# Patient Record
Sex: Female | Born: 1950 | Race: Asian | Hispanic: No | Smoking: Never smoker
Health system: Southern US, Community
[De-identification: ages and names within clinical notes are randomized; demographics above are authoritative.]

## PROBLEM LIST (undated history)

## (undated) DIAGNOSIS — N390 Urinary tract infection, site not specified: Secondary | ICD-10-CM

## (undated) DIAGNOSIS — S52502A Unspecified fracture of the lower end of left radius, initial encounter for closed fracture: Secondary | ICD-10-CM

## (undated) DIAGNOSIS — E785 Hyperlipidemia, unspecified: Secondary | ICD-10-CM

## (undated) DIAGNOSIS — I10 Essential (primary) hypertension: Secondary | ICD-10-CM

## (undated) DIAGNOSIS — D509 Iron deficiency anemia, unspecified: Secondary | ICD-10-CM

## (undated) DIAGNOSIS — M1990 Unspecified osteoarthritis, unspecified site: Secondary | ICD-10-CM

## (undated) DIAGNOSIS — K2970 Gastritis, unspecified, without bleeding: Secondary | ICD-10-CM

## (undated) HISTORY — PX: HAND SURGERY: SHX662

---

## 2002-09-02 ENCOUNTER — Encounter (INDEPENDENT_AMBULATORY_CARE_PROVIDER_SITE_OTHER): Payer: Self-pay | Admitting: Specialist

## 2002-09-02 ENCOUNTER — Ambulatory Visit (HOSPITAL_COMMUNITY): Admission: RE | Admit: 2002-09-02 | Discharge: 2002-09-02 | Payer: Self-pay | Admitting: Gastroenterology

## 2005-05-22 ENCOUNTER — Emergency Department (HOSPITAL_COMMUNITY): Admission: EM | Admit: 2005-05-22 | Discharge: 2005-05-22 | Payer: Self-pay | Admitting: Emergency Medicine

## 2006-06-13 ENCOUNTER — Other Ambulatory Visit: Admission: RE | Admit: 2006-06-13 | Discharge: 2006-06-13 | Payer: Self-pay | Admitting: Obstetrics & Gynecology

## 2006-06-27 ENCOUNTER — Encounter: Admission: RE | Admit: 2006-06-27 | Discharge: 2006-06-27 | Payer: Self-pay | Admitting: Obstetrics & Gynecology

## 2006-08-07 ENCOUNTER — Emergency Department (HOSPITAL_COMMUNITY): Admission: EM | Admit: 2006-08-07 | Discharge: 2006-08-07 | Payer: Self-pay | Admitting: Emergency Medicine

## 2006-08-09 ENCOUNTER — Emergency Department (HOSPITAL_COMMUNITY): Admission: EM | Admit: 2006-08-09 | Discharge: 2006-08-09 | Payer: Self-pay | Admitting: Emergency Medicine

## 2006-11-27 ENCOUNTER — Emergency Department (HOSPITAL_COMMUNITY): Admission: EM | Admit: 2006-11-27 | Discharge: 2006-11-27 | Payer: Self-pay | Admitting: Family Medicine

## 2006-12-04 ENCOUNTER — Emergency Department (HOSPITAL_COMMUNITY): Admission: EM | Admit: 2006-12-04 | Discharge: 2006-12-04 | Payer: Self-pay | Admitting: Emergency Medicine

## 2006-12-20 ENCOUNTER — Encounter: Admission: RE | Admit: 2006-12-20 | Discharge: 2006-12-20 | Payer: Self-pay | Admitting: Internal Medicine

## 2007-03-20 ENCOUNTER — Emergency Department (HOSPITAL_COMMUNITY): Admission: EM | Admit: 2007-03-20 | Discharge: 2007-03-20 | Payer: Self-pay | Admitting: Emergency Medicine

## 2007-06-10 ENCOUNTER — Other Ambulatory Visit: Admission: RE | Admit: 2007-06-10 | Discharge: 2007-06-10 | Payer: Self-pay | Admitting: Obstetrics and Gynecology

## 2008-02-03 ENCOUNTER — Emergency Department (HOSPITAL_COMMUNITY): Admission: EM | Admit: 2008-02-03 | Discharge: 2008-02-03 | Payer: Self-pay | Admitting: Emergency Medicine

## 2008-06-10 ENCOUNTER — Other Ambulatory Visit: Admission: RE | Admit: 2008-06-10 | Discharge: 2008-06-10 | Payer: Self-pay | Admitting: Obstetrics & Gynecology

## 2009-04-14 ENCOUNTER — Encounter: Admission: RE | Admit: 2009-04-14 | Discharge: 2009-04-14 | Payer: Self-pay | Admitting: Family Medicine

## 2011-01-01 ENCOUNTER — Encounter: Payer: Self-pay | Admitting: Family Medicine

## 2011-01-23 ENCOUNTER — Ambulatory Visit (HOSPITAL_BASED_OUTPATIENT_CLINIC_OR_DEPARTMENT_OTHER)
Admission: RE | Admit: 2011-01-23 | Discharge: 2011-01-23 | Disposition: A | Discharge status: Home / Self Care | Payer: Worker's Compensation | Source: Ambulatory Visit | Attending: Orthopedic Surgery | Admitting: Orthopedic Surgery

## 2011-01-23 DIAGNOSIS — M65839 Other synovitis and tenosynovitis, unspecified forearm: Secondary | ICD-10-CM | POA: Insufficient documentation

## 2011-01-23 DIAGNOSIS — G5600 Carpal tunnel syndrome, unspecified upper limb: Secondary | ICD-10-CM | POA: Insufficient documentation

## 2011-01-23 DIAGNOSIS — M65849 Other synovitis and tenosynovitis, unspecified hand: Secondary | ICD-10-CM | POA: Insufficient documentation

## 2011-01-23 LAB — POCT I-STAT, CHEM 8: TCO2: 29 mmol/L (ref 0–100)

## 2011-01-29 NOTE — Op Note (Signed)
Brianna Camacho, Brianna Camacho                    ACCOUNT NO.:  [id number]  MEDICAL RECORD NO.:  [id number]          PATIENT TYPE:  LOCATION:                                 FACILITY:  PHYSICIAN:  Cindee Salt, M.D.            DATE OF BIRTH:  DATE OF PROCEDURE:  01/23/2011 DATE OF DISCHARGE:                              OPERATIVE REPORT   PREOPERATIVE DIAGNOSIS:  Stenosing tenosynovitis of left middle finger, carpal tunnel syndrome, left hand.  POSTOPERATIVE DIAGNOSIS:  Stenosing tenosynovitis of left middle finger, carpal tunnel syndrome, left hand.  OPERATION:  Release A1 pulley left middle finger, left carpal tunnel release.  SURGEON:  Cindee Salt, MD  ASSISTANT:  Betha Loa, MD  ANESTHESIA:  General with local infiltration.  ANESTHESIOLOGIST:  Janetta Hora. Gelene Mink, MD.  HISTORY:  The patient is a 60 year old female with a history of numbness and tingling, catching of her left middle finger.  Nerve conductions are positive for carpal tunnel syndrome.  This has not responded to conservative treatment.  She has elected to undergo surgical decompression.  Pre, peri and postoperative course were discussed along with risks and complications through an interpreter in the preoperative area.  Questions again are encouraged and answered.  Extremity marked by both the patient and surgeon.  Antibiotic given.  PROCEDURE IN DETAILS:  The patient was brought to the operating room where a general anesthetic was carried out without difficulty.  She was prepped using ChloraPrep, supine position, left arm free.  A 3-minute dry time was allowed.  Time-out taken confirming the patient procedure. An oblique incision was made over the A1 pulley of the left middle finger, carried down through subcutaneous tissue.  Bleeders were electrocauterized with bipolar.  Dissection carried down.  The A1 pulley was identified.  This was released.  A small incision was made centrally in the A2 pulley, incision  through A1 was made on its radial aspect.  A separate incision was then made over the carpal canal, carried down through subcutaneous tissue.  Bleeders again electrocauterized with bipolar.  The dissection carried down to the palmar fascia.  This was split revealing superficial palmar arch.  The flexor tendon of the ring little finger identified.  Retractors placed ulnarly and radially and a incision made through the carpal retinaculum to the ulnar side of the median nerve.  A right-angle and Sewall retractor were placed between skin and forearm fascia.  The fascia released for approximately a centimeter and half proximal to the wrist crease under direct vision. Canal was explored.  Air compression to the nerve was apparent.  Motor branch was noted under in the muscle.  The wounds were irrigated and closed with interrupted 5-0 Vicryl Rapide sutures.  Sterile compressive dressing and splint to the wrist was applied.  On deflation of the tourniquet, all fingers immediately pinked.  She was taken to the recovery room for observation in satisfactory condition.  She will be discharged home to return to the [facility] [place] in 1 week on Vicodin.          ______________________________  Cindee Salt, M.D.     GK/MEDQ  D:  01/23/2011  T:  01/24/2011  Job:  [id number]  Electronically Signed by Cindee Salt M.D. on 01/29/2011 02:26:12 PM

## 2011-03-08 ENCOUNTER — Emergency Department (HOSPITAL_COMMUNITY)
Admission: EM | Admit: 2011-03-08 | Discharge: 2011-03-08 | Disposition: A | Discharge status: Home / Self Care | Payer: BC Managed Care – PPO | Attending: Emergency Medicine | Admitting: Emergency Medicine

## 2011-03-08 ENCOUNTER — Emergency Department (HOSPITAL_COMMUNITY): Payer: BC Managed Care – PPO

## 2011-03-08 DIAGNOSIS — K219 Gastro-esophageal reflux disease without esophagitis: Secondary | ICD-10-CM | POA: Insufficient documentation

## 2011-03-08 DIAGNOSIS — R05 Cough: Secondary | ICD-10-CM | POA: Insufficient documentation

## 2011-03-08 DIAGNOSIS — Z79899 Other long term (current) drug therapy: Secondary | ICD-10-CM | POA: Insufficient documentation

## 2011-03-08 DIAGNOSIS — I10 Essential (primary) hypertension: Secondary | ICD-10-CM | POA: Insufficient documentation

## 2011-03-08 DIAGNOSIS — R110 Nausea: Secondary | ICD-10-CM | POA: Insufficient documentation

## 2011-03-08 DIAGNOSIS — R509 Fever, unspecified: Secondary | ICD-10-CM | POA: Insufficient documentation

## 2011-03-08 DIAGNOSIS — R51 Headache: Secondary | ICD-10-CM | POA: Insufficient documentation

## 2011-03-08 DIAGNOSIS — R059 Cough, unspecified: Secondary | ICD-10-CM | POA: Insufficient documentation

## 2011-03-08 DIAGNOSIS — R000 Tachycardia, unspecified: Secondary | ICD-10-CM | POA: Insufficient documentation

## 2011-03-08 LAB — CSF CELL COUNT WITH DIFFERENTIAL
Lymphs, CSF: 38 % — ABNORMAL LOW (ref 40–80)
RBC Count, CSF: 1390 /mm3 — ABNORMAL HIGH
Segmented Neutrophils-CSF: 46 % — ABNORMAL HIGH (ref 0–6)
Segmented Neutrophils-CSF: NONE SEEN % (ref 0–6)

## 2011-03-08 LAB — URINALYSIS, ROUTINE W REFLEX MICROSCOPIC
Glucose, UA: NEGATIVE mg/dL
Ketones, ur: NEGATIVE mg/dL
Protein, ur: NEGATIVE mg/dL

## 2011-03-08 LAB — CBC
HCT: 33.7 % — ABNORMAL LOW (ref 36.0–46.0)
Hemoglobin: 11 g/dL — ABNORMAL LOW (ref 12.0–15.0)
MCH: 21.6 pg — ABNORMAL LOW (ref 26.0–34.0)
Platelets: 545 10*3/uL — ABNORMAL HIGH (ref 150–400)

## 2011-03-08 LAB — BASIC METABOLIC PANEL
Chloride: 101 mEq/L (ref 96–112)
Creatinine, Ser: 1.52 mg/dL — ABNORMAL HIGH (ref 0.4–1.2)
GFR calc Af Amer: 42 mL/min — ABNORMAL LOW (ref >60)
Potassium: 3 mEq/L — ABNORMAL LOW (ref 3.5–5.1)
Sodium: 135 mEq/L (ref 135–145)

## 2011-03-08 LAB — DIFFERENTIAL
Basophils Relative: 0 % (ref 0–1)
Eosinophils Absolute: 0 10*3/uL (ref 0.0–0.7)
Lymphs Abs: 0.5 10*3/uL — ABNORMAL LOW (ref 0.7–4.0)
Monocytes Absolute: 1.1 10*3/uL — ABNORMAL HIGH (ref 0.1–1.0)
Neutro Abs: 16.7 10*3/uL — ABNORMAL HIGH (ref 1.7–7.7)

## 2011-03-08 LAB — SEDIMENTATION RATE: Sed Rate: 58 mm/hr — ABNORMAL HIGH (ref 0–22)

## 2011-03-08 LAB — PROTEIN, CSF: Total  Protein, CSF: 30 mg/dL (ref 15–45)

## 2011-03-08 LAB — PROCALCITONIN: Procalcitonin: 4.48 ng/mL

## 2011-03-08 LAB — LACTIC ACID, PLASMA: Lactic Acid, Venous: 1.8 mmol/L (ref 0.5–2.2)

## 2011-03-08 LAB — URINE MICROSCOPIC-ADD ON

## 2011-03-09 ENCOUNTER — Emergency Department (HOSPITAL_COMMUNITY): Payer: BC Managed Care – PPO

## 2011-03-09 ENCOUNTER — Inpatient Hospital Stay (HOSPITAL_COMMUNITY)
Admission: EM | Admit: 2011-03-09 | Discharge: 2011-03-11 | DRG: 584 | Disposition: A | Discharge status: Home / Self Care | Payer: BC Managed Care – PPO | Attending: Internal Medicine | Admitting: Internal Medicine

## 2011-03-09 DIAGNOSIS — R7881 Bacteremia: Principal | ICD-10-CM | POA: Diagnosis present

## 2011-03-09 DIAGNOSIS — D509 Iron deficiency anemia, unspecified: Secondary | ICD-10-CM | POA: Diagnosis present

## 2011-03-09 DIAGNOSIS — J189 Pneumonia, unspecified organism: Secondary | ICD-10-CM | POA: Diagnosis present

## 2011-03-09 DIAGNOSIS — A498 Other bacterial infections of unspecified site: Secondary | ICD-10-CM | POA: Diagnosis present

## 2011-03-09 DIAGNOSIS — I10 Essential (primary) hypertension: Secondary | ICD-10-CM | POA: Diagnosis present

## 2011-03-09 DIAGNOSIS — E785 Hyperlipidemia, unspecified: Secondary | ICD-10-CM | POA: Diagnosis present

## 2011-03-09 DIAGNOSIS — N390 Urinary tract infection, site not specified: Secondary | ICD-10-CM | POA: Diagnosis present

## 2011-03-09 LAB — CBC
MCH: 21.7 pg — ABNORMAL LOW (ref 26.0–34.0)
MCV: 66.2 fL — ABNORMAL LOW (ref 78.0–100.0)
Platelets: 453 10*3/uL — ABNORMAL HIGH (ref 150–400)
RBC: 4.79 MIL/uL (ref 3.87–5.11)
RDW: 15.7 % — ABNORMAL HIGH (ref 11.5–15.5)
WBC: 11.4 10*3/uL — ABNORMAL HIGH (ref 4.0–10.5)

## 2011-03-09 LAB — URINALYSIS, ROUTINE W REFLEX MICROSCOPIC
Glucose, UA: NEGATIVE mg/dL
pH: 7 (ref 5.0–8.0)

## 2011-03-09 LAB — URINE MICROSCOPIC-ADD ON

## 2011-03-09 LAB — POCT I-STAT, CHEM 8
Chloride: 110 mEq/L (ref 96–112)
HCT: 33 % — ABNORMAL LOW (ref 36.0–46.0)
Hemoglobin: 11.2 g/dL — ABNORMAL LOW (ref 12.0–15.0)
TCO2: 22 mmol/L (ref 0–100)

## 2011-03-09 LAB — URINALYSIS, DIPSTICK ONLY
Bilirubin Urine: NEGATIVE
Glucose, UA: NEGATIVE mg/dL
Ketones, ur: NEGATIVE mg/dL
Nitrite: NEGATIVE

## 2011-03-09 LAB — DIFFERENTIAL
Basophils Absolute: 0 10*3/uL (ref 0.0–0.1)
Eosinophils Absolute: 0.1 10*3/uL (ref 0.0–0.7)
Lymphs Abs: 1.1 10*3/uL (ref 0.7–4.0)
Monocytes Absolute: 0.8 10*3/uL (ref 0.1–1.0)
Monocytes Relative: 7 % (ref 3–12)
Neutro Abs: 9.4 10*3/uL — ABNORMAL HIGH (ref 1.7–7.7)
Neutrophils Relative %: 82 % — ABNORMAL HIGH (ref 43–77)

## 2011-03-09 LAB — URINE CULTURE: Culture  Setup Time: 201203291706

## 2011-03-10 LAB — COMPREHENSIVE METABOLIC PANEL
ALT: 39 U/L — ABNORMAL HIGH (ref 0–35)
Alkaline Phosphatase: 106 U/L (ref 39–117)
Chloride: 109 mEq/L (ref 96–112)
GFR calc non Af Amer: 54 mL/min — ABNORMAL LOW (ref >60)
Glucose, Bld: 114 mg/dL — ABNORMAL HIGH (ref 70–99)
Total Bilirubin: 0.4 mg/dL (ref 0.3–1.2)
Total Protein: 6 g/dL (ref 6.0–8.3)

## 2011-03-10 LAB — URINE CULTURE: Culture  Setup Time: 201203301507

## 2011-03-10 LAB — CBC
HCT: 29 % — ABNORMAL LOW (ref 36.0–46.0)
MCH: 21 pg — ABNORMAL LOW (ref 26.0–34.0)
RBC: 4.47 MIL/uL (ref 3.87–5.11)
RDW: 15.4 % (ref 11.5–15.5)

## 2011-03-10 LAB — TSH: TSH: 1.312 u[IU]/mL (ref 0.350–4.500)

## 2011-03-11 LAB — URINE CULTURE
Colony Count: NO GROWTH
Culture: NO GROWTH
Special Requests: POSITIVE

## 2011-03-11 LAB — FERRITIN: Ferritin: 96 ng/mL (ref 10–291)

## 2011-03-11 LAB — CBC
Hemoglobin: 8.8 g/dL — ABNORMAL LOW (ref 12.0–15.0)
WBC: 8.3 10*3/uL (ref 4.0–10.5)

## 2011-03-11 LAB — CULTURE, BLOOD (ROUTINE X 2): Culture  Setup Time: 201203291535

## 2011-03-11 LAB — IRON AND TIBC
Iron: 71 ug/dL (ref 42–135)
TIBC: 266 ug/dL (ref 250–470)

## 2011-03-12 LAB — CSF CULTURE W GRAM STAIN
Culture: NO GROWTH
Gram Stain: NONE SEEN

## 2011-03-15 LAB — CULTURE, BLOOD (ROUTINE X 2)

## 2011-03-30 NOTE — Discharge Summary (Signed)
NAMEMARCELLENE, SHIVLEY                   ACCOUNT NO.:  [id number]  MEDICAL RECORD NO.:  [id number]           PATIENT TYPE:  I  LOCATION:  5522                         FACILITY:  [facility]  PHYSICIAN:  Angeliyah Kirkey I Johnston Maddocks, MD      DATE OF BIRTH:  [birth year]  DATE OF ADMISSION:  03/09/2011 DATE OF DISCHARGE:  03/11/2011                              DISCHARGE SUMMARY   PRIMARY CARE PHYSICIAN:  Unassigned.  PRIMARY DIAGNOSES: 1. Escherichia coli bacteremia most likely from urinary tract     infection, pansensitive. 2. Iron-deficiency anemia. 3. Hypertension. 4. Left lower lobe community-acquired pneumonia.  PROCEDURES:  Chest x-ray consideration that if lower lobe suspicious for infection, follow up chest x-ray is recommended in 6-8 weeks to ensure cardiological resolution.  CONSULTATION:  None.  HISTORY OF PRESENT ILLNESS:  This is a 60 year old female who was seen in the emergency room 2 days prior to admission with history of fevers and headache.  The patient seen on February 26, 2011.  At that time, a CBC and BMET were done.  An LP was done which did show white blood cells of 0 and rbc's of 1, symptomatic because there is another lumbar puncture was done, white blood cells of 16 and rbc's of 13, iron 0.  The patient's glucose on the CSF was 106 and total protein was 30. Procalcitonin was 4.48, lactic acid was 1.8.  The patient was discharged home as meningitis was ruled out.  The patient received a call about positive blood culture and was asked to come to the emergency room for further assessment.  Accordingly, the patient admitted under medical record [id number].  On the day of admission, the patient had no fever.  The patient continued to complain of mild headache and some shortness of breath.  Chest x-ray suggesting pneumonia.  The patient is started on broad-spectrum antibiotics mainly Zithromax, terazosin and Cipro to cover for gram-negative rods.  PROBLEMS: 1. Escherichia coli  bacteremia most probably source is urinary tract     infection.  Repeat blood cultures, preliminary reports negative at     this time.  Escherichia coli is pansensitive.  The patient will be     discharged with Cipro 500 mg p.o. b.i.d. for 14 days, and the     patient was advised to followup with her primary care physician     after completion of treatment to assure resolution of infection.     Mostly likely cause of the patient's infection is urinary tract     infection. 2. Anemia with iron-deficiency anemia.  The patient noticed to be on     nonsteroid anti-inflammatory drugs which was stopped.  The patient     was advised to follow up with her primary care physician to arrange     endoscopy and colonoscopy and Percocet was prescribed.  The patient     had colonoscopy in the past.  The patient's daughter-in-law     informed about importance of repeating blood pressure to assure the     resolution of infection and also advised to do upper and lower  endoscopy.  Currently, the patient denies any chest pain or     shortness of breath.  Denies any nausea or vomiting.  PHYSICAL EXAMINATION:  HEART:  S1 and S2 with no added sounds. LUNGS:  No vesicular breathing with equal air entry. ABDOMEN:  Soft, nontender.  Bowel sounds positive. EXTREMITIES:  No lower extremity edema. BACK:  There is no point tenderness.  Currently, we felt the patient is medically stable for discharge.     Kimiko Common Bosie Helper, MD     HIE/MEDQ  D:  03/11/2011  T:  03/12/2011  Job:  [id number]  Electronically Signed by Ebony Cargo MD on 03/30/2011 05:36:17 AM

## 2011-04-05 NOTE — H&P (Signed)
NAMELoney Camacho                ACCOUNT NO.:  [id number]  MEDICAL RECORD NO.:  [id number]          PATIENT TYPE:  LOCATION:                                 FACILITY:  PHYSICIAN:  Brianna Camacho I Brianna Chrisley, MD      DATE OF BIRTH:  28-Dec-1950  DATE OF ADMISSION:  03/09/2011 DATE OF DISCHARGE:                             HISTORY & PHYSICAL   CHIEF COMPLAINT:  The patient received call from the emergency room with positive blood culture.  HISTORY OF PRESENT ILLNESS:  This is a 60 year old female who has two medical record number, the first medical record number by the name of Brianna Camacho, Brianna Camacho #[id number] where the patient presented to the emergency room in the name of Brianna Camacho with fever, chills.  Fever reached 101 associated with severe headache. At that time, blood culture was drawn from the patient.  The patient found to have white blood cells of 18.3, hemoglobin of 11, and hematocrit 33.7, ESR 58.  At that time, the patient underwent lumbar puncture and CSF fluid glucose was found to be 106 and protein 30, white blood cell 0 and rbc's 1 and apparently it was traumatic with white blood cell 16 and rbc 13.90.  The patient was discharged home from the emergency room with some pain medication as the patient did not have any evidence of meningitis.  Today, the patient received a call from the ED, she have Gram-negative in both blood culture and we were asked to admit the patient for further assessment. Currently, the patient is still complaining of low-grade headache. Denies any nausea or vomiting or abdominal pain.  Denies any coughing. She had a chest x-ray which was suspect left lower lobe pneumonia and we were asked to admit from Gram-positive blood culture and the above pneumonia.  ALLERGIES:  No known drug allergies.  PAST SURGICAL HISTORY:  None.  MEDICATIONS: 1. Losartan/hydrochlorothiazide. 2. Zocor. 3. Nabumetone 500 mg one tab twice daily.  SOCIAL HISTORY:  The patient works full  time.  Denies any alcohol abuse. Denies any smoking.  Denies any illicit drug abuse.  Apparently, the patient was offered colonoscopy in the past, but because of her busy schedule have not done till now.  SYSTEMIC REVIEW:  The patient denies any blurring of vision.  Denies any numbness or weakness of her extremities.  Denies any back pain or neck pain.  She complained of mild headache almost 4/7, relieved with some pain medication.  The patient denies any worsening headache.  The patient denies any shortness of breath or chest pain.  Denies any nausea or vomiting or abdominal pain.  Denies any burning micturition.  PHYSICAL EXAMINATION:  VITAL SIGNS:  Temperature 97.9, blood pressure 106/71, pulse rate 89, respiratory rate 19, saturation 92% on room air. The patient is not in respiratory distress or shortness of breath. HEENT:  Pupils are equal, reactive to light and accommodation. Extraocular muscle movement within normal. NECK:  Supple. No lymphadenopathy. HEART:  S1 and S2 with no added sounds. LUNGS:  Normal vesicular breathing with equal air entry. ABDOMEN:  Soft, nontender.  Bowel sounds positive. EXTREMITIES:  No lower  limb edema.  Peripheral pulses intact. CNS:  The patient is awake, alert, oriented x3 with no focal neurological deficit.  BLOOD WORKUP:  Hemoglobin 11.2, hematocrit 33.  Sodium 142, potassium 3.7, chloride 110, glucose 110, BUN 13, creatinine 1.3.  CBC, white blood cells 11.4, hemoglobin 10.4, hematocrit 31.7, and platelets 453. ESR 77.  Urinalysis with small leukocytes, white blood cells 7-10. Chest x-ray suggest community-acquired pneumonia.  ASSESSMENT AND PLAN: 1. Gram-negative rod on blood, two bottles, causes could be the urine.     We will start the patient on Cipro 400 mg IV and we will continue     with Rocephin and Zithromax to cover for the community-acquired     pneumonia.  We will repeat blood culture and urinalysis and     culture. 2.  Community-acquired pneumonia.  We will continue with Zithromax and     Rocephin.  The patient currently has cough, only complain of flu-     like symptoms. 3. Anemia, seem like iron-deficiency anemia.  We will get anemia panel     and stool guaiac. 4. Acute renal failure.  We will continue with IV fluid hydration.     Further recommendation as hospital course progress.  The patient     have LB which seem at the beginning traumatic, at the beginning     seem fine.  There is no evidence of virus or meningitis.  It is     likely traumatic with high white blood cells.  If headache did not     improve, we will proceed with MRI of the brain.  The patient has a     CT head which was negative on March 08, 2011, CT head same negative     without any hydrocephalus or acute infarct or masses.  Further     recommendation per hospital course.     Brianna Camacho Brianna Helper, MD     HIE/MEDQ  D:  04/03/2011  T:  04/03/2011  Job:  [id number]  Electronically Signed by Brianna Cargo MD on 04/05/2011 09:07:12 AM

## 2011-04-27 NOTE — Op Note (Signed)
NAME:  Brianna Camacho, Brianna Camacho                           ACCOUNT NO.:  [id number]   MEDICAL RECORD NO.:  [id number]                   PATIENT TYPE:  AMB   LOCATION:  ENDO                                 FACILITY:  [facility]   PHYSICIAN:  Charna Elizabeth, M.D.                   DATE OF BIRTH:  [birth year]   DATE OF PROCEDURE:  09/02/2002  DATE OF DISCHARGE:                                 OPERATIVE REPORT   PROCEDURE:  Esophagogastroduodenoscopy with biopsies.   ENDOSCOPIST:  Charna Elizabeth, M.D.   INSTRUMENT USED:  Olympus video panendoscope.   INDICATION FOR PROCEDURE:  Iron-deficiency anemia with hemoglobin down to  8.1 g/dl in a 61 year old Asian female.  Rule out peptic ulcer disease,  esophagitis, gastritis, etc.   PREPROCEDURE PREPARATION:  Informed consent was procured from the patient.  The patient was fasted for eight hours prior to the procedure.   PREPROCEDURE PHYSICAL:  VITAL SIGNS:  The patient had stable vital signs.  NECK:  Supple.  CHEST:  Clear to auscultation.  S1, S2 regular.  ABDOMEN:  Soft with normal bowel sounds.   DESCRIPTION OF PROCEDURE:  The patient was placed in the left lateral  decubitus position and sedated with 50 mg of Demerol and 5 mg of Versed  intravenously.  Once the patient was adequately sedate and maintained on low-  flow oxygen and continuous cardiac monitoring, the Olympus video  panendoscope was advanced through the mouthpiece, over the tongue, into the  esophagus under direct vision.  The entire esophagus appeared normal with no  evidence of ring, stricture, masses, esophagitis, or Barrett's mucosa.  The  scope was then advanced into the stomach.  There was severe, diffuse  hemorrhagic gastritis throughout the gastric mucosa.  Biopsies were done  from the antrum to rule out the presence of Helicobacter pylori by  pathology.  The rest of the exam was normal.  The proximal small bowel,  including the duodenal bulb, appeared normal and without lesions.   IMPRESSION:  1. Severe diffuse hemorrhagic gastritis, biopsies done for Helicobacter     pylori by pathology.  2. Normal-appearing esophagus and proximal small bowel.  3. No ulcers, erosions, or masses seen.   RECOMMENDATIONS:  1. Avoid all nonsteroidals, including aspirin.  2.     Await pathology results, treat with antibiotics if H. pylori present.  3. Prevacid 30 mg one p.o. q.d. has been prescribed for the patient, #30     have been prescribed with three refills.  4. Proceed with a colonoscopy at this time to further evaluate the patient's     iron-deficiency anemia.                                               Charna Elizabeth, M.D.  JM/MEDQ  D:  09/02/2002  T:  09/03/2002  Job:  [id number]   cc:   Earlene Plater L. Cloward, M.D.  [address]  [place]  [place] [address]  Fax: [phone]

## 2011-04-27 NOTE — Op Note (Signed)
   NAME:  Brianna Camacho, Brianna Camacho                           ACCOUNT NO.:  [id number]   MEDICAL RECORD NO.:  [id number]                   PATIENT TYPE:  AMB   LOCATION:  ENDO                                 FACILITY:  [facility]   PHYSICIAN:  Charna Elizabeth, M.D.                   DATE OF BIRTH:  01/06/1951   DATE OF PROCEDURE:  09/02/2002  DATE OF DISCHARGE:                                 OPERATIVE REPORT   PROCEDURE PERFORMED:  Screening colonoscopy.   ENDOSCOPIST:  Charna Elizabeth, M.D.   INSTRUMENT USED:  Olympus video colonoscope   INDICATIONS FOR PROCEDURE:  Iron deficiency anemia with hemoglobin down to  8.1 gm per dl in a 60 year old, Asian female.  Rule out colonic polyps,  masses, hemorrhoids, etc.   PREPROCEDURE PREPARATION:  Informed consent was procured from the patient.  The patient was fasted for eight hours prior to the procedure and prepped  with a bottle of magnesium citrate and a gallon of NuLytely the night prior  to the procedure.   PREPROCEDURE PHYSICAL:  VITAL SIGNS:  The patient had stable vital signs.  NECK:  Supple.  CHEST:  Clear to auscultation.  S1 and S2 regular.  ABDOMEN:  Soft with normal bowel sounds.   DESCRIPTION OF PROCEDURE:  The patient was placed in the left lateral  decubitus position and sedated with Demerol and Versed for EGD.  No  additional sedation was used for the colonoscopy.  Once the patient was  adequately sedated and maintained on low flow oxygen and continuous cardiac  monitoring, the Olympus video colonoscopic was advanced from the rectum to  the cecum and terminal ileum without difficulty.  The entire exam was normal  with no masses, polyps, erosions, ulcerations, ulcerations or diverticula  seen.   IMPRESSION:  Normal colonoscopy.    RECOMMENDATIONS:  1. Check CBC again today to monitor hemoglobin closely.  2. Outpatient follow up in the next two weeks, earlier if need be.                                               Charna Elizabeth,  M.D.    JM/MEDQ  D:  09/02/2002  T:  09/03/2002  Job:  [id number]   cc:   Earlene Plater L. Cloward, M.D.  [address]  [place]  [place] [address]  Fax: [phone]

## 2012-03-05 ENCOUNTER — Other Ambulatory Visit: Payer: Self-pay

## 2012-03-05 ENCOUNTER — Encounter (HOSPITAL_COMMUNITY): Payer: Self-pay | Admitting: *Deleted

## 2012-03-05 ENCOUNTER — Inpatient Hospital Stay (HOSPITAL_COMMUNITY)
Admission: EM | Admit: 2012-03-05 | Discharge: 2012-03-07 | DRG: 379 | Disposition: A | Discharge status: Home / Self Care | Payer: Self-pay | Attending: Internal Medicine | Admitting: Internal Medicine

## 2012-03-05 DIAGNOSIS — K254 Chronic or unspecified gastric ulcer with hemorrhage: Principal | ICD-10-CM | POA: Diagnosis present

## 2012-03-05 DIAGNOSIS — R531 Weakness: Secondary | ICD-10-CM

## 2012-03-05 DIAGNOSIS — D509 Iron deficiency anemia, unspecified: Secondary | ICD-10-CM | POA: Diagnosis present

## 2012-03-05 DIAGNOSIS — K279 Peptic ulcer, site unspecified, unspecified as acute or chronic, without hemorrhage or perforation: Secondary | ICD-10-CM | POA: Diagnosis present

## 2012-03-05 DIAGNOSIS — R5383 Other fatigue: Secondary | ICD-10-CM | POA: Diagnosis present

## 2012-03-05 DIAGNOSIS — R5381 Other malaise: Secondary | ICD-10-CM | POA: Diagnosis present

## 2012-03-05 DIAGNOSIS — K922 Gastrointestinal hemorrhage, unspecified: Secondary | ICD-10-CM | POA: Diagnosis present

## 2012-03-05 DIAGNOSIS — K921 Melena: Secondary | ICD-10-CM | POA: Diagnosis present

## 2012-03-05 DIAGNOSIS — I10 Essential (primary) hypertension: Secondary | ICD-10-CM | POA: Diagnosis present

## 2012-03-05 DIAGNOSIS — D649 Anemia, unspecified: Secondary | ICD-10-CM | POA: Diagnosis present

## 2012-03-05 DIAGNOSIS — E785 Hyperlipidemia, unspecified: Secondary | ICD-10-CM | POA: Diagnosis present

## 2012-03-05 HISTORY — DX: Gastritis, unspecified, without bleeding: K29.70

## 2012-03-05 HISTORY — DX: Essential (primary) hypertension: I10

## 2012-03-05 HISTORY — DX: Iron deficiency anemia, unspecified: D50.9

## 2012-03-05 HISTORY — DX: Hyperlipidemia, unspecified: E78.5

## 2012-03-05 LAB — CBC
HCT: 30.8 % — ABNORMAL LOW (ref 36.0–46.0)
Hemoglobin: 9.7 g/dL — ABNORMAL LOW (ref 12.0–15.0)
MCH: 21.1 pg — ABNORMAL LOW (ref 26.0–34.0)
MCHC: 31.5 g/dL (ref 30.0–36.0)
MCV: 67 fL — ABNORMAL LOW (ref 78.0–100.0)
Platelets: 705 10*3/uL — ABNORMAL HIGH (ref 150–400)
RBC: 4.6 MIL/uL (ref 3.87–5.11)
RDW: 15.4 % (ref 11.5–15.5)
WBC: 8.9 10*3/uL (ref 4.0–10.5)

## 2012-03-05 LAB — OCCULT BLOOD, POC DEVICE: Fecal Occult Bld: POSITIVE

## 2012-03-05 LAB — DIFFERENTIAL
Band Neutrophils: 0 % (ref 0–10)
Basophils Absolute: 0.1 10*3/uL (ref 0.0–0.1)
Basophils Relative: 1 % (ref 0–1)
Blasts: 0 %
Eosinophils Absolute: 0.1 10*3/uL (ref 0.0–0.7)
Eosinophils Relative: 1 % (ref 0–5)
Lymphocytes Relative: 22 % (ref 12–46)
Lymphs Abs: 2 10*3/uL (ref 0.7–4.0)
Metamyelocytes Relative: 0 %
Monocytes Absolute: 0.4 10*3/uL (ref 0.1–1.0)
Monocytes Relative: 5 % (ref 3–12)
Myelocytes: 0 %
Neutro Abs: 6.3 10*3/uL (ref 1.7–7.7)
Neutrophils Relative %: 71 % (ref 43–77)
Promyelocytes Absolute: 0 %
Smear Review: INCREASED
nRBC: 0 /100 WBC

## 2012-03-05 LAB — BASIC METABOLIC PANEL
BUN: 37 mg/dL — ABNORMAL HIGH (ref 6–23)
CO2: 24 mEq/L (ref 19–32)
Calcium: 9.2 mg/dL (ref 8.4–10.5)
Creatinine, Ser: 1.12 mg/dL — ABNORMAL HIGH (ref 0.50–1.10)
Glucose, Bld: 93 mg/dL (ref 70–99)

## 2012-03-05 MED ORDER — GI COCKTAIL ~~LOC~~
30.0000 mL | Freq: Once | ORAL | Status: AC
  Administered 2012-03-05: 30 mL via ORAL
  Filled 2012-03-05: qty 30

## 2012-03-05 MED ORDER — SODIUM CHLORIDE 0.9 % IV SOLN
Freq: Once | INTRAVENOUS | Status: AC
  Administered 2012-03-05: 20:00:00 via INTRAVENOUS

## 2012-03-05 MED ORDER — PANTOPRAZOLE SODIUM 40 MG IV SOLR
40.0000 mg | Freq: Once | INTRAVENOUS | Status: AC
  Administered 2012-03-05: 40 mg via INTRAVENOUS
  Filled 2012-03-05: qty 40

## 2012-03-05 NOTE — ED Notes (Signed)
Patient c/o black, tarry stools.

## 2012-03-05 NOTE — H&P (Signed)
PCP:  none   Chief Complaint:   Generalized weakness  HPI: Brianna Camacho is a 61 y.o. female   has a past medical history of Gastric ulcer; Hypertension; and Hyperlipemia.   Presented with  Weakness and melena for 2 days. She  has history of melena in the past with admittions. Last endoscopy done in 2003 by Dr. Loreta Ave showing Severe diffuse hemorrhagic gastritis but No ulcers, erosions, or masses seen. Colonoscopy at that time was negative. She denies any nausea vomiting or diarrhea. She had any fevers she does endorse some epigastric pain. She was states she does not take any aspirin ibuprofen Motrin Advil. Only takes medications as prescribed. History is somewhat difficult to obtain as patient is vietnamese speaking in [place] is somewhat limited.  Her Hg from 4/12 was 8.4, with chronicly low MCV at ~65  Hemoccult positive from below.   Review of Systems:    Pertinent positives include: melena, fatigue,  Constitutional:  No weight loss, night sweats, Fevers, chills,  weight loss  HEENT:  No headaches, Difficulty swallowing,Tooth/dental problems,Sore throat,  No sneezing, itching, ear ache, nasal congestion, post nasal drip,  Cardio-vascular:  No chest pain, Orthopnea, PND, anasarca, dizziness, palpitations.no Bilateral lower extremity swelling  GI:  No heartburn, indigestion, abdominal pain, nausea, vomiting, diarrhea, change in bowel habits, loss of appetite, , blood in stool, hematoemesis Resp:  no shortness of breath at rest. No dyspnea on exertion, No excess mucus, no productive cough, No non-productive cough, No coughing up of blood.No change in color of mucus.No wheezing. Skin:  no rash or lesions. No jaundice GU:  no dysuria, change in color of urine, no urgency or frequency. No straining to urinate.  No flank pain.  Musculoskeletal:  No joint pain or no joint swelling. No decreased range of motion. No back pain.  Psych:  No change in mood or affect. No depression or  anxiety. No memory loss.  Neuro: no localizing neurological complaints, no tingling, no weakness, no double vision, no gait abnormality, no slurred speech, no confusion  Otherwise ROS are negative except for above, 10 systems were reviewed  Past Medical History: Past Medical History  Diagnosis Date  . Gastric ulcer   . Hypertension   . Hyperlipemia    Past Surgical History  Procedure Date  . Cesarean section      Medications: Prior to Admission medications   Medication Sig Start Date End Date Taking? Authorizing Provider  Choline Fenofibrate (TRILIPIX) 135 MG capsule Take 135 mg by mouth daily.   Yes Historical Provider, MD  irbesartan (AVAPRO) 300 MG tablet Take 300 mg by mouth every morning.   Yes Historical Provider, MD  simvastatin (ZOCOR) 40 MG tablet Take 40 mg by mouth every morning.   Yes Historical Provider, MD    Allergies:  No Known Allergies  Social History:  Ambulatory  independently  Lives at home with husband   reports that she has never smoked. She does not have any smokeless tobacco history on file. She reports that she does not drink alcohol or use illicit drugs.   Family History: family history is not on file.    Physical Exam: Patient Vitals for the past 24 hrs:  BP Temp Temp src Pulse Resp SpO2 Height Weight  03/05/12 2309 124/85 mmHg 98.3 F (36.8 C) Oral 81  20  100 % - -  03/05/12 2032 133/85 mmHg - - 104  - - - -  03/05/12 2031 142/64 mmHg - - 90  - - - -  03/05/12 2030 138/66 mmHg - - 95  - - - -  03/05/12 1947 151/77 mmHg 98.4 F (36.9 C) Oral 85  16  99 % 5\' 3"  (1.6 m) 61.236 kg (135 lb)  03/05/12 1435 138/90 mmHg 98.2 F (36.8 C) Oral 100  16  100 % - -    1. General:  in No Acute distress 2. Psychological: Alert and Oriented 3. Head/ENT:   Moist  Mucous Membranes, pale                          Head Non traumatic, neck supple                          Normal Dentition 4. SKIN: normal Skin turgor,  Skin clean Dry and intact no  rash 5. Heart: Regular rate and rhythm no Murmur, Rub or gallop 6. Lungs: Clear to auscultation bilaterally, no wheezes or crackles   7. Abdomen: Soft, mild epigastric tenderness, Non distended 8. Lower extremities: no clubbing, cyanosis, or edema 9. Neurologically Grossly intact, moving all 4 extremities equally 10. MSK: Normal range of motion  body mass index is 23.91 kg/(m^2).   Labs on Admission:   [facility] 03/05/12 1947  NA 138  K 4.1  CL 104  CO2 24  GLUCOSE 93  BUN 37*  CREATININE 1.12*  CALCIUM 9.2  MG --  PHOS --   No results found for this basename: AST:2,ALT:2,ALKPHOS:2,BILITOT:2,PROT:2,ALBUMIN:2 in the last 72 hours No results found for this basename: LIPASE:2,AMYLASE:2 in the last 72 hours  Basename 03/05/12 1452  WBC 8.9  NEUTROABS 6.3  HGB 9.7*  HCT 30.8*  MCV 67.0*  PLT 705*    Basename 03/05/12 2113  CKTOTAL --  CKMB --  CKMBINDEX --  TROPONINI <0.30   No results found for this basename: TSH,T4TOTAL,FREET3,T3FREE,THYROIDAB in the last 72 hours No results found for this basename: VITAMINB12:2,FOLATE:2,FERRITIN:2,TIBC:2,IRON:2,RETICCTPCT:2 in the last 72 hours No results found for this basename: HGBA1C    Estimated Creatinine Clearance: 43.1 ml/min (by C-G formula based on Cr of 1.12). ABG    Component Value Date/Time   TCO2 22 03/09/2011 1409     No results found for this basename: DDIMER     Other results:  I have pearsonaly reviewed this: ECG REPORT  Rate: 84  Rhythm: NSR ST&T Change: no ischemic changes  Cultures:    Component Value Date/Time   SDES URINE, RANDOM 03/09/2011 2219   SPECREQUEST IMMUNE:NORM UT SYMPT:POS 03/09/2011 2219   CULT NO GROWTH 03/09/2011 2219   REPTSTATUS 03/11/2011 FINAL 03/09/2011 2219       Radiological Exams on Admission: No results found.  Assessment/Plan  61 year old female who with history of iron deficiency anemia and gastritis here with melena and stable anemia but somewhat more symptomatic  with new onset of episode of melena for 2 days  Present on Admission:  .Melena - most likely upper GI bleed patient have had history of gastritis in the past will give 80 mg Protonix now and follow up with twice a day dosing, patient does appear to be somewhat stable although she had an episode of tachycardia. She is normotensive. Denies lightheadedness. Given that her melena only been going on for 2 days will watch in the step down until we can tell  the severity of this. Will need GI consult in a.m. also that if decompensates, CBC every 6 hours, will give IV fluids. I suspect she  has chronic iron deficiency anemia.   .Anemia - obtain type and screen transfuse if hemoglobin are low 7 or the patient actively bleeding, I would expect her hemoglobin to drop with IV rehydration  . history of gastritis - make sure she is on Protonix, no recent NSAID use History of hypertension will hold her Avapro for now   Prophylaxis: SCD  Protonix   I have spent a total of 60 min on this admition  Khali Albanese 03/05/2012, 11:26 PM

## 2012-03-05 NOTE — ED Notes (Signed)
Per interpreter, vietnamese, patient is here due to feeling tired when she woke today.  She states when she went to bathroom,  Her stool was very dark.  She had similar sx last year with ulcer.  Patient denies abd pain.

## 2012-03-05 NOTE — ED Notes (Addendum)
First meeting with patient. Patient speaks very little english mainly [place]. Patient points to abdomen and states little pain. Patient resting with NAD at this time. Family at bedside.

## 2012-03-05 NOTE — ED Notes (Signed)
Used interpreter phones to update pt on plan of care, warm blanket given, NAD

## 2012-03-05 NOTE — ED Provider Notes (Signed)
History     CSN: [id number]  Arrival date & time 03/05/12  1425   First MD Initiated Contact with Patient 03/05/12 2001      Chief Complaint  Patient presents with  . Weakness     HPI  History provided by the patient and the anatomy is interpreter. Patient is a 61 year old female with history of hypertension, hyperlipidemia and gastric ulcer who presents with complaints of weakness earlier this morning and dark black stools. Patient reports having only one bowel movement today was a black thick stool. She also reports feeling lightheaded earlier in the day with some weakness and generalized fatigue. Patient reports that this has improved over the course the day and evening. Patient has not had any additional bowel movements. Patient also had some associated epigastric dull pain and burning. Patient states symptoms feel similar to previous diagnosis of gastric ulcer one year ago. Patient reports being in the hospital for that episode and did have a upper endoscopy and colonoscopy performed but does not recall the doctor's names. Patient currently does not have a primary care provider. Patient denies having any shortness of breath, near syncope or syncopal episodes today. Patient denies any chest pain or heart palpitations. Patient denies any other aggravating or alleviating factors.      Past Medical History  Diagnosis Date  . Gastric ulcer   . Hypertension   . Hyperlipemia     Past Surgical History  Procedure Date  . Cesarean section     No family history on file.  History  Substance Use Topics  . Smoking status: Never Smoker   . Smokeless tobacco: Not on file  . Alcohol Use: No    OB History    Grav Para Term Preterm Abortions TAB SAB Ect Mult Living                  Review of Systems  Constitutional: Positive for fatigue. Negative for fever and chills.  Respiratory: Negative for cough and shortness of breath.   Cardiovascular: Negative for chest pain and  palpitations.  Gastrointestinal: Negative for nausea, vomiting, diarrhea and constipation.  Neurological: Positive for light-headedness. Negative for dizziness, syncope and numbness.    Allergies  Review of patient's allergies indicates no known allergies.  Home Medications   Current Outpatient Rx  Name Route Sig Dispense Refill  . CHOLINE FENOFIBRATE 135 MG PO CPDR Oral Take 135 mg by mouth daily.    . IRBESARTAN 300 MG PO TABS Oral Take 300 mg by mouth every morning.    Marland Kitchen SIMVASTATIN 40 MG PO TABS Oral Take 40 mg by mouth every morning.      BP 151/77  Pulse 85  Temp(Src) 98.4 F (36.9 C) (Oral)  Resp 16  Ht 5\' 3"  (1.6 m)  Wt 135 lb (61.236 kg)  BMI 23.91 kg/m2  SpO2 99%  Physical Exam  Nursing note and vitals reviewed. Constitutional: She is oriented to person, place, and time. She appears well-developed and well-nourished. No distress.  HENT:  Head: Normocephalic and atraumatic.  Mouth/Throat: Oropharynx is clear and moist.  Eyes: Conjunctivae and EOM are normal. Pupils are equal, round, and reactive to light.  Neck: Normal range of motion. Neck supple.       No meningeal signs  Cardiovascular: Normal rate and regular rhythm.   Pulmonary/Chest: Effort normal and breath sounds normal. No respiratory distress. She has no wheezes. She has no rales.  Abdominal: Soft. She exhibits no distension. There is no tenderness. There  is no rebound.  Musculoskeletal: She exhibits no edema and no tenderness.  Neurological: She is alert and oriented to person, place, and time.  Skin: Skin is warm and dry. No rash noted.       Normal skin turgor  Psychiatric: She has a normal mood and affect. Her behavior is normal.    ED Course  Procedures   Results for orders placed during the hospital encounter of 03/05/12  CBC      Component Value Range   WBC 8.9  4.0 - 10.5 (K/uL)   RBC 4.60  3.87 - 5.11 (MIL/uL)   Hemoglobin 9.7 (*) 12.0 - 15.0 (g/dL)   HCT 47.8 (*) 29.5 - 46.0 (%)    MCV 67.0 (*) 78.0 - 100.0 (fL)   MCH 21.1 (*) 26.0 - 34.0 (pg)   MCHC 31.5  30.0 - 36.0 (g/dL)   RDW 62.1  30.8 - 65.7 (%)   Platelets 705 (*) 150 - 400 (K/uL)  DIFFERENTIAL      Component Value Range   Neutrophils Relative 71  43 - 77 (%)   Lymphocytes Relative 22  12 - 46 (%)   Monocytes Relative 5  3 - 12 (%)   Eosinophils Relative 1  0 - 5 (%)   Basophils Relative 1  0 - 1 (%)   Band Neutrophils 0  0 - 10 (%)   Metamyelocytes Relative 0     Myelocytes 0     Promyelocytes Absolute 0     Blasts 0     nRBC 0  0 (/100 WBC)   Neutro Abs 6.3  1.7 - 7.7 (K/uL)   Lymphs Abs 2.0  0.7 - 4.0 (K/uL)   Monocytes Absolute 0.4  0.1 - 1.0 (K/uL)   Eosinophils Absolute 0.1  0.0 - 0.7 (K/uL)   Basophils Absolute 0.1  0.0 - 0.1 (K/uL)   RBC Morphology TARGET CELLS     Smear Review PLATELETS APPEAR INCREASED    BASIC METABOLIC PANEL      Component Value Range   Sodium 138  135 - 145 (mEq/L)   Potassium 4.1  3.5 - 5.1 (mEq/L)   Chloride 104  96 - 112 (mEq/L)   CO2 24  19 - 32 (mEq/L)   Glucose, Bld 93  70 - 99 (mg/dL)   BUN 37 (*) 6 - 23 (mg/dL)   Creatinine, Ser 8.46 (*) 0.50 - 1.10 (mg/dL)   Calcium 9.2  8.4 - 96.2 (mg/dL)   GFR calc non Af Amer 52 (*) >90 (mL/min)   GFR calc Af Amer 60 (*) >90 (mL/min)  SAMPLE TO BLOOD BANK      Component Value Range   Blood Bank Specimen SAMPLE AVAILABLE FOR TESTING     Sample Expiration 03/06/2012    OCCULT BLOOD, POC DEVICE      Component Value Range   Fecal Occult Bld POSITIVE          1. Weakness   2. GI bleed       MDM  8:30 PM patient seen and evaluated. Patient in no acute distress.  Patient discussed with attending physician. Patient with weakness, positive Hemoccult and signs of anemia. Anemia appears chronic but could have concerns for acute process. Patient without primary care followup and language barrier. Plan for admission for patient.  Spoke with [facility] they will see pt and admit.   Date: 03/05/2012  Rate: 84   Rhythm: normal sinus rhythm  QRS Axis: normal  Intervals: normal  ST/T  Wave abnormalities: normal  Conduction Disutrbances:none  Narrative Interpretation: Possible left atrial enlargement  Old EKG Reviewed: No significant changes from 03/10/2011     Angus Seller, PA 03/06/12 [phone]

## 2012-03-06 ENCOUNTER — Encounter (HOSPITAL_COMMUNITY): Payer: Self-pay | Admitting: *Deleted

## 2012-03-06 ENCOUNTER — Other Ambulatory Visit: Payer: Self-pay

## 2012-03-06 ENCOUNTER — Encounter (HOSPITAL_COMMUNITY)
Admission: EM | Disposition: A | Discharge status: Home / Self Care | Payer: Self-pay | Source: Home / Self Care | Attending: Internal Medicine

## 2012-03-06 DIAGNOSIS — E785 Hyperlipidemia, unspecified: Secondary | ICD-10-CM | POA: Diagnosis present

## 2012-03-06 DIAGNOSIS — I10 Essential (primary) hypertension: Secondary | ICD-10-CM | POA: Diagnosis present

## 2012-03-06 DIAGNOSIS — R531 Weakness: Secondary | ICD-10-CM | POA: Diagnosis present

## 2012-03-06 DIAGNOSIS — K922 Gastrointestinal hemorrhage, unspecified: Secondary | ICD-10-CM | POA: Diagnosis present

## 2012-03-06 HISTORY — PX: HOT HEMOSTASIS: SHX5433

## 2012-03-06 HISTORY — PX: ESOPHAGOGASTRODUODENOSCOPY: SHX5428

## 2012-03-06 LAB — CBC
HCT: 26.9 % — ABNORMAL LOW (ref 36.0–46.0)
HCT: 26.9 % — ABNORMAL LOW (ref 36.0–46.0)
HCT: 27.1 % — ABNORMAL LOW (ref 36.0–46.0)
HCT: 27.2 % — ABNORMAL LOW (ref 36.0–46.0)
Hemoglobin: 9 g/dL — ABNORMAL LOW (ref 12.0–15.0)
Hemoglobin: 8.4 g/dL — ABNORMAL LOW (ref 12.0–15.0)
MCH: 21.7 pg — ABNORMAL LOW (ref 26.0–34.0)
MCH: 21.8 pg — ABNORMAL LOW (ref 26.0–34.0)
MCV: 67 fL — ABNORMAL LOW (ref 78.0–100.0)
MCV: 67.9 fL — ABNORMAL LOW (ref 78.0–100.0)
MCV: 68.2 fL — ABNORMAL LOW (ref 78.0–100.0)
Platelets: 590 10*3/uL — ABNORMAL HIGH (ref 150–400)
Platelets: 600 10*3/uL — ABNORMAL HIGH (ref 150–400)
Platelets: 657 10*3/uL — ABNORMAL HIGH (ref 150–400)
RBC: 3.98 MIL/uL (ref 3.87–5.11)
RBC: 3.98 MIL/uL (ref 3.87–5.11)
RBC: 3.99 MIL/uL (ref 3.87–5.11)
RBC: 4.06 MIL/uL (ref 3.87–5.11)
RBC: 4.15 MIL/uL (ref 3.87–5.11)
RDW: 15.6 % — ABNORMAL HIGH (ref 11.5–15.5)
WBC: 12.1 10*3/uL — ABNORMAL HIGH (ref 4.0–10.5)
WBC: 5.5 10*3/uL (ref 4.0–10.5)
WBC: 6.6 10*3/uL (ref 4.0–10.5)
WBC: 7.1 10*3/uL (ref 4.0–10.5)

## 2012-03-06 LAB — SAMPLE TO BLOOD BANK

## 2012-03-06 LAB — COMPREHENSIVE METABOLIC PANEL
AST: 21 U/L (ref 0–37)
Albumin: 3.4 g/dL — ABNORMAL LOW (ref 3.5–5.2)
BUN: 30 mg/dL — ABNORMAL HIGH (ref 6–23)
Calcium: 8.7 mg/dL (ref 8.4–10.5)
Chloride: 109 mEq/L (ref 96–112)
Creatinine, Ser: 1.01 mg/dL (ref 0.50–1.10)
Total Bilirubin: 0.3 mg/dL (ref 0.3–1.2)

## 2012-03-06 LAB — MAGNESIUM: Magnesium: 2.2 mg/dL (ref 1.5–2.5)

## 2012-03-06 LAB — MRSA PCR SCREENING: MRSA by PCR: NEGATIVE

## 2012-03-06 LAB — RETICULOCYTES: Retic Count, Absolute: 65 10*3/uL (ref 19.0–186.0)

## 2012-03-06 LAB — FERRITIN: Ferritin: 59 ng/mL (ref 10–291)

## 2012-03-06 LAB — PHOSPHORUS: Phosphorus: 3.1 mg/dL (ref 2.3–4.6)

## 2012-03-06 LAB — TSH: TSH: 2.682 u[IU]/mL (ref 0.350–4.500)

## 2012-03-06 LAB — APTT: aPTT: 29 seconds (ref 24–37)

## 2012-03-06 LAB — IRON AND TIBC
Iron: 84 ug/dL (ref 42–135)
Saturation Ratios: 19 % — ABNORMAL LOW (ref 20–55)
UIBC: 347 ug/dL (ref 125–400)

## 2012-03-06 LAB — FOLATE: Folate: 18.6 ng/mL

## 2012-03-06 LAB — TYPE AND SCREEN

## 2012-03-06 LAB — PROTIME-INR: INR: 1.01 (ref 0.00–1.49)

## 2012-03-06 SURGERY — EGD (ESOPHAGOGASTRODUODENOSCOPY)
Anesthesia: Moderate Sedation

## 2012-03-06 MED ORDER — ONDANSETRON HCL 4 MG/2ML IJ SOLN: 4.0000 mg | Freq: Four times a day (QID) | INTRAMUSCULAR | Status: DC | PRN

## 2012-03-06 MED ORDER — SODIUM CHLORIDE 0.9 % IV SOLN
80.0000 mg | Freq: Once | INTRAVENOUS | Status: AC
  Administered 2012-03-06: 80 mg via INTRAVENOUS
  Filled 2012-03-06: qty 80

## 2012-03-06 MED ORDER — SODIUM CHLORIDE 0.9 % IJ SOLN
3.0000 mL | Freq: Two times a day (BID) | INTRAMUSCULAR | Status: DC
  Administered 2012-03-07: 3 mL via INTRAVENOUS

## 2012-03-06 MED ORDER — ACETAMINOPHEN 325 MG PO TABS: 650.0000 mg | ORAL_TABLET | Freq: Four times a day (QID) | ORAL | Status: DC | PRN

## 2012-03-06 MED ORDER — DOCUSATE SODIUM 100 MG PO CAPS
100.0000 mg | ORAL_CAPSULE | Freq: Two times a day (BID) | ORAL | Status: DC
  Administered 2012-03-07: 100 mg via ORAL
  Filled 2012-03-06: qty 1

## 2012-03-06 MED ORDER — PANTOPRAZOLE SODIUM 40 MG IV SOLR
40.0000 mg | Freq: Two times a day (BID) | INTRAVENOUS | Status: DC
  Administered 2012-03-06 – 2012-03-07 (×2): 40 mg via INTRAVENOUS
  Filled 2012-03-06 (×5): qty 40

## 2012-03-06 MED ORDER — SODIUM CHLORIDE 0.9 % IV SOLN
INTRAVENOUS | Status: AC
  Administered 2012-03-06: 100 mL/h via INTRAVENOUS

## 2012-03-06 MED ORDER — ALBUTEROL SULFATE (5 MG/ML) 0.5% IN NEBU: 2.5000 mg | INHALATION_SOLUTION | RESPIRATORY_TRACT | Status: DC | PRN

## 2012-03-06 MED ORDER — FENTANYL CITRATE 0.05 MG/ML IJ SOLN
INTRAMUSCULAR | Status: AC
  Filled 2012-03-06: qty 2

## 2012-03-06 MED ORDER — FENTANYL NICU IV SYRINGE 50 MCG/ML
INJECTION | INTRAMUSCULAR | Status: DC | PRN
  Administered 2012-03-06 (×2): 25 ug via INTRAVENOUS

## 2012-03-06 MED ORDER — GUAIFENESIN-DM 100-10 MG/5ML PO SYRP: 5.0000 mL | ORAL_SOLUTION | ORAL | Status: DC | PRN

## 2012-03-06 MED ORDER — HYDROCODONE-ACETAMINOPHEN 5-325 MG PO TABS
1.0000 | ORAL_TABLET | ORAL | Status: DC | PRN
  Administered 2012-03-06: 1 via ORAL
  Filled 2012-03-06: qty 1

## 2012-03-06 MED ORDER — ONDANSETRON HCL 4 MG PO TABS: 4.0000 mg | ORAL_TABLET | Freq: Four times a day (QID) | ORAL | Status: DC | PRN

## 2012-03-06 MED ORDER — ALUM & MAG HYDROXIDE-SIMETH 200-200-20 MG/5ML PO SUSP: 30.0000 mL | Freq: Four times a day (QID) | ORAL | Status: DC | PRN

## 2012-03-06 MED ORDER — ACETAMINOPHEN 650 MG RE SUPP: 650.0000 mg | Freq: Four times a day (QID) | RECTAL | Status: DC | PRN

## 2012-03-06 MED ORDER — MIDAZOLAM HCL 10 MG/2ML IJ SOLN
INTRAMUSCULAR | Status: AC
  Filled 2012-03-06: qty 2

## 2012-03-06 MED ORDER — MIDAZOLAM HCL 10 MG/2ML IJ SOLN
INTRAMUSCULAR | Status: DC | PRN
  Administered 2012-03-06 (×2): 2 mg via INTRAVENOUS

## 2012-03-06 MED ORDER — BUTAMBEN-TETRACAINE-BENZOCAINE 2-2-14 % EX AERO
INHALATION_SPRAY | CUTANEOUS | Status: DC | PRN
  Administered 2012-03-06: 2 via TOPICAL

## 2012-03-06 MED ORDER — SODIUM CHLORIDE 0.9 % IV SOLN
INTRAVENOUS | Status: DC
  Administered 2012-03-06 – 2012-03-07 (×2): via INTRAVENOUS

## 2012-03-06 MED ORDER — SODIUM CHLORIDE 0.9 % IV SOLN
INTRAVENOUS | Status: DC
  Administered 2012-03-06: 14:00:00 via INTRAVENOUS

## 2012-03-06 NOTE — Progress Notes (Signed)
Utilization Review Completed.Dorcas Carrow T3/28/2013

## 2012-03-06 NOTE — Interval H&P Note (Signed)
History and Physical Interval Note:  03/06/2012 3:00 PM  [name]  has presented today for surgery, with the diagnosis of Anemia and melena  The various methods of treatment have been discussed with the patient and family. After consideration of risks, benefits and other options for treatment, the patient has consented to  Procedure(s) (LRB): ESOPHAGOGASTRODUODENOSCOPY (EGD) (N/A) as a surgical intervention .  The patients' history has been reviewed, patient examined, no change in status, stable for surgery.  I have reviewed the patients' chart and labs.  Questions were answered to the patient's satisfaction.     Kelii Chittum D

## 2012-03-06 NOTE — Progress Notes (Signed)
  [facility]   Subjective: History obtained through use of telephone translator. Patient speaks only [place] Currently denies abdominal pain. No apparent melena or upper red bleeding.  Objective: Vital signs in last 24 hours: Temp:  [98.1 F (36.7 C)-98.8 F (37.1 C)] 98.8 F (37.1 C) (03/28 1352) Pulse Rate:  [81-105] 95  (03/28 1352) Resp:  [15-20] 18  (03/28 1352) BP: (119-151)/(63-90) 149/81 mmHg (03/28 1352) SpO2:  [98 %-100 %] 99 % (03/28 1352) Weight:  [60.5 kg (133 lb 6.1 oz)-61.236 kg (135 lb)] 60.5 kg (133 lb 6.1 oz) (03/28 0321) Weight change:  Last BM Date: 03/05/12  Intake/Output from previous day: 03/27 0701 - 03/28 0700 In: 400 [I.V.:400] Out: -  Intake/Output this shift: Total I/O In: 500 [I.V.:500] Out: -   General appearance: alert, cooperative, appears stated age and no distress Resp: clear to auscultation bilaterally, room air saturation is 9% Cardio: regular rate and rhythm, S1, S2 normal, no murmur, click, rub or gallop GI: soft, non-tender; bowel sounds normal; no masses,  no organomegaly Extremities: extremities normal, atraumatic, no cyanosis or edema Neurologic: Grossly normal  Lab Results:  Basename 03/06/12 0839 03/06/12 0320  WBC 5.5 6.6  HGB 8.7* 9.0*  HCT 26.9* 28.3*  PLT 600* 590*   BMET  Basename 03/06/12 0320 03/05/12 1947  NA 140 138  K 3.9 4.1  CL 109 104  CO2 21 24  GLUCOSE 84 93  BUN 30* 37*  CREATININE 1.01 1.12*  CALCIUM 8.7 9.2    Studies/Results: No results found.  Medications:  I have reviewed the patient's current medications. Scheduled:   . sodium chloride   Intravenous Once  . docusate sodium  100 mg Oral BID  . gi cocktail  30 mL Oral Once  . pantoprazole (PROTONIX) IV  80 mg Intravenous Once  . pantoprazole (PROTONIX) IV  40 mg Intravenous Once  . pantoprazole (PROTONIX) IV  40 mg Intravenous Q12H  . sodium chloride  3 mL Intravenous Q12H    Assessment/Plan:  Principal Problem:  *Upper GIB (gastrointestinal bleeding)/ Peptic ulcer disease *Last evaluated as an outpatient 2003 for apparent anemia. At that time she had apparently been experiencing melena an upper endoscopy performed by Dr. Loreta Ave demonstrated diffuse gastritis *No apparent melena this admission and patient denies use of end-stage *Appreciate GI evaluation *For upper endoscopy today *Continue IV protonix  Active Problems:  Anemia *Hemoglobin has drifted downward from 9.7 and admission to 8.7 today *Continue to follow CBC    Weakness generalized *Suspect etiology from symptomatic anemia   HTN (hypertension) *Blood pressure stable *Home Avapro on hold   Dyslipidemia *Home Zocor on hold   Disposition *Remain in stepdown until source of GI bleeding clarified    LOS: 1 day   Junious Silk, ANP pager [phone]  [facility] 1 [email or web address] Password: [email or web address]  03/06/2012, 2:20 PM  I have examined the patient and reviewed the chart. I agree with the above note. EGD reveals multiple ulcers. Will cont IVF for hydration. She has been started on clear liquids. We will cont to follow in SDu for rebleeding.   Calvert Cantor, MD [phone]

## 2012-03-06 NOTE — ED Notes (Signed)
Due to patient name being incorrect all labs (previous and new orders) are being redrawn at this time.

## 2012-03-06 NOTE — Consult Note (Signed)
Reason for Consult:Melena and heme positive stool Referring Physician: [facility]  Brianna Camacho HPI: This is a 61 year old female with history of iron deficiency anemia who was evaluated by Dr. Loreta Ave in 2003 and most recently in 05/2011.  She underwent and EGD and colonoscopy both times revealing a gastritis.  No evidence of any overt bleeding and the gastric biopsies were negative for H. Pylori.  The patient started to notice melena over the past 1-2 days and she had an associated weakness.  No history of NSAID use and no complaints of abdominal pain.  As a result of her symptoms she presented to the ER and she was identified to have a microcytic anemia with heme positive stools.  As a result of her presentation a GI consultation was requested.  Past Medical History  Diagnosis Date  . Gastritis   . Hypertension   . Hyperlipemia   . Iron deficiency anemia     Past Surgical History  Procedure Date  . Cesarean section     History reviewed. No pertinent family history.  Social History:  reports that she has never smoked. She does not have any smokeless tobacco history on file. She reports that she does not drink alcohol or use illicit drugs.  Allergies: No Known Allergies  Medications:  Scheduled:   . sodium chloride   Intravenous Once  . docusate sodium  100 mg Oral BID  . gi cocktail  30 mL Oral Once  . pantoprazole (PROTONIX) IV  80 mg Intravenous Once  . pantoprazole (PROTONIX) IV  40 mg Intravenous Once  . pantoprazole (PROTONIX) IV  40 mg Intravenous Q12H  . sodium chloride  3 mL Intravenous Q12H   Continuous:   . sodium chloride 100 mL/hr (03/06/12 0344)  . sodium chloride 20 mL/hr at 03/06/12 1421    Results for orders placed during the hospital encounter of 03/05/12 (from the past 24 hour(s))  BASIC METABOLIC PANEL     Status: Abnormal   Collection Time   03/05/12  7:47 PM      Component Value Range   Sodium 138  135 - 145 (mEq/L)   Potassium 4.1  3.5 -  5.1 (mEq/L)   Chloride 104  96 - 112 (mEq/L)   CO2 24  19 - 32 (mEq/L)   Glucose, Bld 93  70 - 99 (mg/dL)   BUN 37 (*) 6 - 23 (mg/dL)   Creatinine, Ser 4.09 (*) 0.50 - 1.10 (mg/dL)   Calcium 9.2  8.4 - 81.1 (mg/dL)   GFR calc non Af Amer 52 (*) >90 (mL/min)   GFR calc Af Amer 60 (*) >90 (mL/min)  SAMPLE TO BLOOD BANK     Status: Normal   Collection Time   03/05/12  7:55 PM      Component Value Range   Blood Bank Specimen SAMPLE AVAILABLE FOR TESTING     Sample Expiration 03/05/2012    OCCULT BLOOD, POC DEVICE     Status: Normal   Collection Time   03/05/12  8:56 PM      Component Value Range   Fecal Occult Bld POSITIVE    TROPONIN I     Status: Normal   Collection Time   03/05/12  9:13 PM      Component Value Range   Troponin I <0.30  <0.30 (ng/mL)  TYPE AND SCREEN     Status: Normal   Collection Time   03/06/12 12:15 AM      Component Value  Range   ABO/RH(D) O POS     Antibody Screen NEG     Sample Expiration 03/09/2012    ABO/RH     Status: Normal   Collection Time   03/06/12 12:15 AM      Component Value Range   ABO/RH(D) O POS    CBC     Status: Abnormal   Collection Time   03/06/12 12:28 AM      Component Value Range   WBC 7.1  4.0 - 10.5 (K/uL)   RBC 4.06  3.87 - 5.11 (MIL/uL)   Hemoglobin 8.6 (*) 12.0 - 15.0 (g/dL)   HCT 91.4 (*) 78.2 - 46.0 (%)   MCV 67.0 (*) 78.0 - 100.0 (fL)   MCH 21.2 (*) 26.0 - 34.0 (pg)   MCHC 31.6  30.0 - 36.0 (g/dL)   RDW 95.6  21.3 - 08.6 (%)   Platelets 657 (*) 150 - 400 (K/uL)  VITAMIN B12     Status: Abnormal   Collection Time   03/06/12 12:28 AM      Component Value Range   Vitamin B-12 187 (*) 211 - 911 (pg/mL)  FOLATE     Status: Normal   Collection Time   03/06/12 12:28 AM      Component Value Range   Folate 18.6    IRON AND TIBC     Status: Abnormal   Collection Time   03/06/12 12:28 AM      Component Value Range   Iron 84  42 - 135 (ug/dL)   TIBC 578  469 - 629 (ug/dL)   Saturation Ratios 19 (*) 20 - 55 (%)   UIBC 347   125 - 400 (ug/dL)  FERRITIN     Status: Normal   Collection Time   03/06/12 12:28 AM      Component Value Range   Ferritin 59  10 - 291 (ng/mL)  RETICULOCYTES     Status: Normal   Collection Time   03/06/12 12:28 AM      Component Value Range   Retic Ct Pct 1.6  0.4 - 3.1 (%)   RBC. 4.06  3.87 - 5.11 (MIL/uL)   Retic Count, Manual 65.0  19.0 - 186.0 (K/uL)  GLUCOSE, CAPILLARY     Status: Normal   Collection Time   03/06/12  2:38 AM      Component Value Range   Glucose-Capillary 76  70 - 99 (mg/dL)  MRSA PCR SCREENING     Status: Normal   Collection Time   03/06/12  3:16 AM      Component Value Range   MRSA by PCR NEGATIVE  NEGATIVE   MAGNESIUM     Status: Normal   Collection Time   03/06/12  3:20 AM      Component Value Range   Magnesium 2.2  1.5 - 2.5 (mg/dL)  PHOSPHORUS     Status: Normal   Collection Time   03/06/12  3:20 AM      Component Value Range   Phosphorus 3.1  2.3 - 4.6 (mg/dL)  TSH     Status: Normal   Collection Time   03/06/12  3:20 AM      Component Value Range   TSH 2.682  0.350 - 4.500 (uIU/mL)  COMPREHENSIVE METABOLIC PANEL     Status: Abnormal   Collection Time   03/06/12  3:20 AM      Component Value Range   Sodium 140  135 - 145 (mEq/L)   Potassium  3.9  3.5 - 5.1 (mEq/L)   Chloride 109  96 - 112 (mEq/L)   CO2 21  19 - 32 (mEq/L)   Glucose, Bld 84  70 - 99 (mg/dL)   BUN 30 (*) 6 - 23 (mg/dL)   Creatinine, Ser 6.57  0.50 - 1.10 (mg/dL)   Calcium 8.7  8.4 - 84.6 (mg/dL)   Total Protein 6.5  6.0 - 8.3 (g/dL)   Albumin 3.4 (*) 3.5 - 5.2 (g/dL)   AST 21  0 - 37 (U/L)   ALT 18  0 - 35 (U/L)   Alkaline Phosphatase 40  39 - 117 (U/L)   Total Bilirubin 0.3  0.3 - 1.2 (mg/dL)   GFR calc non Af Amer 58 (*) >90 (mL/min)   GFR calc Af Amer 68 (*) >90 (mL/min)  CBC     Status: Abnormal   Collection Time   03/06/12  3:20 AM      Component Value Range   WBC 6.6  4.0 - 10.5 (K/uL)   RBC 4.15  3.87 - 5.11 (MIL/uL)   Hemoglobin 9.0 (*) 12.0 - 15.0 (g/dL)    HCT 96.2 (*) 95.2 - 46.0 (%)   MCV 68.2 (*) 78.0 - 100.0 (fL)   MCH 21.7 (*) 26.0 - 34.0 (pg)   MCHC 31.8  30.0 - 36.0 (g/dL)   RDW 84.1  32.4 - 40.1 (%)   Platelets 590 (*) 150 - 400 (K/uL)  APTT     Status: Normal   Collection Time   03/06/12  3:20 AM      Component Value Range   aPTT 29  24 - 37 (seconds)  PROTIME-INR     Status: Normal   Collection Time   03/06/12  3:20 AM      Component Value Range   Prothrombin Time 13.5  11.6 - 15.2 (seconds)   INR 1.01  0.00 - 1.49   CBC     Status: Abnormal   Collection Time   03/06/12  8:39 AM      Component Value Range   WBC 5.5  4.0 - 10.5 (K/uL)   RBC 3.98  3.87 - 5.11 (MIL/uL)   Hemoglobin 8.7 (*) 12.0 - 15.0 (g/dL)   HCT 02.7 (*) 25.3 - 46.0 (%)   MCV 67.6 (*) 78.0 - 100.0 (fL)   MCH 21.9 (*) 26.0 - 34.0 (pg)   MCHC 32.3  30.0 - 36.0 (g/dL)   RDW 66.4 (*) 40.3 - 15.5 (%)   Platelets 600 (*) 150 - 400 (K/uL)     No results found.  ROS:  As stated above in the HPI otherwise negative.  Blood pressure 130/85, pulse 95, temperature 98.8 F (37.1 C), temperature source Oral, resp. rate 16, height 5\' 3"  (1.6 m), weight 60.5 kg (133 lb 6.1 oz), SpO2 97.00%.    PE: Gen: NAD, Alert and Oriented HEENT:  [place]/AT, EOMI Neck: Supple, no LAD Lungs: CTA Bilaterally CV: RRR without M/G/R ABM: Soft, NTND, +BS Ext: No C/C/E  Assessment/Plan: 1) Melena. 2) Heme positive stool. 3) IDA.   There is a high likelihood that her bleeding site is from the upper GI tract.  Plan: 1) EGD today.  Shakeem Stern D 03/06/2012, 3:22 PM

## 2012-03-06 NOTE — Op Note (Signed)
[facility] [address] [place], [place]  [address]  OPERATIVE PROCEDURE REPORT  PATIENT:  Meloni, Hinz  MR#:  [id number] BIRTHDATE:  08-29-1951  GENDER:  female ENDOSCOPIST:  Jeani Hawking, MD ASSISTANT:  Beryle Beams, Technician and Janae Sauce, RN PROCEDURE DATE:  03/06/2012 PROCEDURE:  EGD with lesion ablation ASA CLASS:  Class II INDICATIONS:  Melena MEDICATIONS:  Demerol 40 mg IV, Versed 4 mg IV TOPICAL ANESTHETIC:  Cetacaine Spray  DESCRIPTION OF PROCEDURE:   After the risks benefits and alternatives of the procedure were thoroughly explained, informed consent was obtained.  The Pentax Gastroscope [id number] endoscope was introduced through the mouth and advanced to the second portion of the duodenum, without limitations.  The instrument was slowly withdrawn as the mucosa was fully examined. <<PROCEDUREIMAGES>>  FINDINGS:  In the antrum multiple ulcers were identified. The majority of the ulcers were in the process of healing, however, one ulcer did exhibit a hemocystic spot. No evidence of any active bleeding. The hemocystic spot was successfully treated with the BICAP probe. No other abnormalities identified.    Retroflexed views revealed no abnormalities.    The scope was then withdrawn from the patient and the procedure terminated.  COMPLICATIONS:  None  IMPRESSION:  1) Antral ulcers. RECOMMENDATIONS:  1) PPI QD indefinitely. 2) Follow up with Dr. Loreta Ave in 2-4 weeks.  ______________________________ Jeani Hawking, MD  n. Rosalie DoctorJeani Hawking at 03/06/2012 03:21 PM  [place], Reali Courtenay, [phone]

## 2012-03-06 NOTE — ED Notes (Signed)
Report called to [facility], RN on 3300. Patient being placed on zoll and being transported to 3308.

## 2012-03-07 LAB — CBC
HCT: 25.2 % — ABNORMAL LOW (ref 36.0–46.0)
HCT: 25.1 % — ABNORMAL LOW (ref 36.0–46.0)
Hemoglobin: 8 g/dL — ABNORMAL LOW (ref 12.0–15.0)
Hemoglobin: 8 g/dL — ABNORMAL LOW (ref 12.0–15.0)
MCH: 21.6 pg — ABNORMAL LOW (ref 26.0–34.0)
MCHC: 31.7 g/dL (ref 30.0–36.0)
MCHC: 31.9 g/dL (ref 30.0–36.0)
MCV: 67.9 fL — ABNORMAL LOW (ref 78.0–100.0)
RBC: 3.71 MIL/uL — ABNORMAL LOW (ref 3.87–5.11)
WBC: 12.9 10*3/uL — ABNORMAL HIGH (ref 4.0–10.5)

## 2012-03-07 LAB — BASIC METABOLIC PANEL
BUN: 16 mg/dL (ref 6–23)
Chloride: 106 mEq/L (ref 96–112)
GFR calc Af Amer: 72 mL/min — ABNORMAL LOW (ref >90)
GFR calc non Af Amer: 62 mL/min — ABNORMAL LOW (ref >90)
Potassium: 3.7 mEq/L (ref 3.5–5.1)
Sodium: 138 mEq/L (ref 135–145)

## 2012-03-07 LAB — IRON AND TIBC
Iron: 13 ug/dL — ABNORMAL LOW (ref 42–135)
Saturation Ratios: 3 % — ABNORMAL LOW (ref 20–55)
TIBC: 384 ug/dL (ref 250–470)
UIBC: 371 ug/dL (ref 125–400)

## 2012-03-07 LAB — FOLATE: Folate: >20 ng/mL

## 2012-03-07 LAB — VITAMIN B12: Vitamin B-12: 176 pg/mL — ABNORMAL LOW (ref 211–911)

## 2012-03-07 MED ORDER — FERROUS FUMARATE 325 (106 FE) MG PO TABS
1.0000 | ORAL_TABLET | Freq: Two times a day (BID) | ORAL | Status: DC
  Administered 2012-03-07: 106 mg via ORAL
  Filled 2012-03-07 (×2): qty 1

## 2012-03-07 MED ORDER — PANTOPRAZOLE SODIUM 40 MG PO TBEC
40.0000 mg | DELAYED_RELEASE_TABLET | Freq: Two times a day (BID) | ORAL | Status: DC
Start: 2012-03-07 — End: 2012-09-28

## 2012-03-07 MED ORDER — FERROUS FUMARATE 325 (106 FE) MG PO TABS: 1.0000 | ORAL_TABLET | Freq: Two times a day (BID) | ORAL | Status: DC

## 2012-03-07 NOTE — Progress Notes (Signed)
D/c home with husband with belongings, explained and discussed d/c instructions, f/u visits, prescriptions and care notes to pt and family via interpreter. Prescriptions given to husband and d/c paperwork. Beryle Quant

## 2012-03-07 NOTE — Discharge Summary (Signed)
DISCHARGE SUMMARY  Brianna Camacho  MR#: [id number]  DOB:Sep 06, 1951  Date of Admission: 03/05/2012 Date of Discharge: 03/07/2012  Attending Physician:Sircharles Holzheimer Butler Denmark, MD  Patient's PCP:No primary provider on file.  Consults: Jeani Hawking, MD- Gastroenterology  Pertinent discharge information: *Found to have gastric ulcers this admission causing GI bleeding which was symptomatic. Was started on Protonix his admission and is to followup with Dr. Loreta Ave after discharge *Does not have an established primary care physician and normally utilizes urgent care for followup. We given the patient the healthconnect number to help establish with a primary care physician   Procedures: Esophagogastroduodenoscopy complete 03/06/2012. Findings were a multiple ulcers in the gastric antrum. The majority of these ulcers were in the process of healing but one ulcer disease with edema cystic spot. No active bleeding. No other adenopathy is identified.  Discharge Diagnoses: Principal Problem:  *Upper GIB (gastrointestinal bleeding) Active Problems:  Anemia with Iron deffiency   Peptic ulcer disease  Weakness generalized  HTN (hypertension)  Dyslipidemia   Radiology: No results found.  Laboratory: Results for orders placed during the hospital encounter of 03/05/12 (from the past 48 hour(s))  CBC     Status: Abnormal   Collection Time   03/05/12  2:52 PM      Component Value Range Comment   WBC 8.9  4.0 - 10.5 (K/uL)    RBC 4.60  3.87 - 5.11 (MIL/uL)    Hemoglobin 9.7 (*) 12.0 - 15.0 (g/dL)    HCT 40.9 (*) 81.1 - 46.0 (%)    MCV 67.0 (*) 78.0 - 100.0 (fL)    MCH 21.1 (*) 26.0 - 34.0 (pg)    MCHC 31.5  30.0 - 36.0 (g/dL)    RDW 91.4  78.2 - 95.6 (%)    Platelets 705 (*) 150 - 400 (K/uL)   DIFFERENTIAL     Status: Normal   Collection Time   03/05/12  2:52 PM      Component Value Range Comment   Neutrophils Relative 71  43 - 77 (%)    Lymphocytes Relative 22  12 - 46 (%)    Monocytes Relative 5  3  - 12 (%)    Eosinophils Relative 1  0 - 5 (%)    Basophils Relative 1  0 - 1 (%)    Band Neutrophils 0  0 - 10 (%)    Metamyelocytes Relative 0      Myelocytes 0      Promyelocytes Absolute 0      Blasts 0      nRBC 0  0 (/100 WBC)    Neutro Abs 6.3  1.7 - 7.7 (K/uL)    Lymphs Abs 2.0  0.7 - 4.0 (K/uL)    Monocytes Absolute 0.4  0.1 - 1.0 (K/uL)    Eosinophils Absolute 0.1  0.0 - 0.7 (K/uL)    Basophils Absolute 0.1  0.0 - 0.1 (K/uL)    RBC Morphology TARGET CELLS      Smear Review PLATELETS APPEAR INCREASED     BASIC METABOLIC PANEL     Status: Abnormal   Collection Time   03/05/12  7:47 PM      Component Value Range Comment   Sodium 138  135 - 145 (mEq/L)    Potassium 4.1  3.5 - 5.1 (mEq/L)    Chloride 104  96 - 112 (mEq/L)    CO2 24  19 - 32 (mEq/L)    Glucose, Bld 93  70 - 99 (mg/dL)  BUN 37 (*) 6 - 23 (mg/dL)    Creatinine, Ser 9.60 (*) 0.50 - 1.10 (mg/dL)    Calcium 9.2  8.4 - 10.5 (mg/dL)    GFR calc non Af Amer 52 (*) >90 (mL/min)    GFR calc Af Amer 60 (*) >90 (mL/min)   SAMPLE TO BLOOD BANK     Status: Normal   Collection Time   03/05/12  7:55 PM      Component Value Range Comment   Blood Bank Specimen SAMPLE AVAILABLE FOR TESTING      Sample Expiration 03/05/2012     OCCULT BLOOD, POC DEVICE     Status: Normal   Collection Time   03/05/12  8:56 PM      Component Value Range Comment   Fecal Occult Bld POSITIVE     TROPONIN I     Status: Normal   Collection Time   03/05/12  9:13 PM      Component Value Range Comment   Troponin I <0.30  <0.30 (ng/mL)   TYPE AND SCREEN     Status: Normal   Collection Time   03/06/12 12:15 AM      Component Value Range Comment   ABO/RH(D) O POS      Antibody Screen NEG      Sample Expiration 03/09/2012     ABO/RH     Status: Normal   Collection Time   03/06/12 12:15 AM      Component Value Range Comment   ABO/RH(D) O POS     CBC     Status: Abnormal   Collection Time   03/06/12 12:28 AM      Component Value Range Comment     WBC 7.1  4.0 - 10.5 (K/uL)    RBC 4.06  3.87 - 5.11 (MIL/uL)    Hemoglobin 8.6 (*) 12.0 - 15.0 (g/dL)    HCT 45.4 (*) 09.8 - 46.0 (%)    MCV 67.0 (*) 78.0 - 100.0 (fL)    MCH 21.2 (*) 26.0 - 34.0 (pg)    MCHC 31.6  30.0 - 36.0 (g/dL)    RDW 11.9  14.7 - 82.9 (%)    Platelets 657 (*) 150 - 400 (K/uL)   VITAMIN B12     Status: Abnormal   Collection Time   03/06/12 12:28 AM      Component Value Range Comment   Vitamin B-12 187 (*) 211 - 911 (pg/mL)   FOLATE     Status: Normal   Collection Time   03/06/12 12:28 AM      Component Value Range Comment   Folate 18.6     IRON AND TIBC     Status: Abnormal   Collection Time   03/06/12 12:28 AM      Component Value Range Comment   Iron 84  42 - 135 (ug/dL)    TIBC 562  130 - 865 (ug/dL)    Saturation Ratios 19 (*) 20 - 55 (%)    UIBC 347  125 - 400 (ug/dL)   FERRITIN     Status: Normal   Collection Time   03/06/12 12:28 AM      Component Value Range Comment   Ferritin 59  10 - 291 (ng/mL)   RETICULOCYTES     Status: Normal   Collection Time   03/06/12 12:28 AM      Component Value Range Comment   Retic Ct Pct 1.6  0.4 - 3.1 (%)    RBC. 4.06  3.87 - 5.11 (MIL/uL)    Retic Count, Manual 65.0  19.0 - 186.0 (K/uL)   GLUCOSE, CAPILLARY     Status: Normal   Collection Time   03/06/12  2:38 AM      Component Value Range Comment   Glucose-Capillary 76  70 - 99 (mg/dL)   MRSA PCR SCREENING     Status: Normal   Collection Time   03/06/12  3:16 AM      Component Value Range Comment   MRSA by PCR NEGATIVE  NEGATIVE    MAGNESIUM     Status: Normal   Collection Time   03/06/12  3:20 AM      Component Value Range Comment   Magnesium 2.2  1.5 - 2.5 (mg/dL)   PHOSPHORUS     Status: Normal   Collection Time   03/06/12  3:20 AM      Component Value Range Comment   Phosphorus 3.1  2.3 - 4.6 (mg/dL)   TSH     Status: Normal   Collection Time   03/06/12  3:20 AM      Component Value Range Comment   TSH 2.682  0.350 - 4.500 (uIU/mL)    COMPREHENSIVE METABOLIC PANEL     Status: Abnormal   Collection Time   03/06/12  3:20 AM      Component Value Range Comment   Sodium 140  135 - 145 (mEq/L)    Potassium 3.9  3.5 - 5.1 (mEq/L)    Chloride 109  96 - 112 (mEq/L)    CO2 21  19 - 32 (mEq/L)    Glucose, Bld 84  70 - 99 (mg/dL)    BUN 30 (*) 6 - 23 (mg/dL)    Creatinine, Ser 5.78  0.50 - 1.10 (mg/dL)    Calcium 8.7  8.4 - 10.5 (mg/dL)    Total Protein 6.5  6.0 - 8.3 (g/dL)    Albumin 3.4 (*) 3.5 - 5.2 (g/dL)    AST 21  0 - 37 (U/L)    ALT 18  0 - 35 (U/L)    Alkaline Phosphatase 40  39 - 117 (U/L)    Total Bilirubin 0.3  0.3 - 1.2 (mg/dL)    GFR calc non Af Amer 58 (*) >90 (mL/min)    GFR calc Af Amer 68 (*) >90 (mL/min)   CBC     Status: Abnormal   Collection Time   03/06/12  3:20 AM      Component Value Range Comment   WBC 6.6  4.0 - 10.5 (K/uL)    RBC 4.15  3.87 - 5.11 (MIL/uL)    Hemoglobin 9.0 (*) 12.0 - 15.0 (g/dL)    HCT 46.9 (*) 62.9 - 46.0 (%)    MCV 68.2 (*) 78.0 - 100.0 (fL)    MCH 21.7 (*) 26.0 - 34.0 (pg)    MCHC 31.8  30.0 - 36.0 (g/dL)    RDW 52.8  41.3 - 24.4 (%)    Platelets 590 (*) 150 - 400 (K/uL)   APTT     Status: Normal   Collection Time   03/06/12  3:20 AM      Component Value Range Comment   aPTT 29  24 - 37 (seconds)   PROTIME-INR     Status: Normal   Collection Time   03/06/12  3:20 AM      Component Value Range Comment   Prothrombin Time 13.5  11.6 - 15.2 (seconds)    INR 1.01  0.00 - 1.49    CBC     Status: Abnormal   Collection Time   03/06/12  8:39 AM      Component Value Range Comment   WBC 5.5  4.0 - 10.5 (K/uL)    RBC 3.98  3.87 - 5.11 (MIL/uL)    Hemoglobin 8.7 (*) 12.0 - 15.0 (g/dL)    HCT 14.7 (*) 82.9 - 46.0 (%)    MCV 67.6 (*) 78.0 - 100.0 (fL)    MCH 21.9 (*) 26.0 - 34.0 (pg)    MCHC 32.3  30.0 - 36.0 (g/dL)    RDW 56.2 (*) 13.0 - 15.5 (%)    Platelets 600 (*) 150 - 400 (K/uL)   CBC     Status: Abnormal   Collection Time   03/06/12  4:24 PM      Component Value  Range Comment   WBC 7.4  4.0 - 10.5 (K/uL)    RBC 3.98  3.87 - 5.11 (MIL/uL)    Hemoglobin 8.4 (*) 12.0 - 15.0 (g/dL)    HCT 86.5 (*) 78.4 - 46.0 (%)    MCV 67.6 (*) 78.0 - 100.0 (fL)    MCH 21.1 (*) 26.0 - 34.0 (pg)    MCHC 31.2  30.0 - 36.0 (g/dL)    RDW 69.6  29.5 - 28.4 (%)    Platelets 596 (*) 150 - 400 (K/uL)   CBC     Status: Abnormal   Collection Time   03/06/12  9:08 PM      Component Value Range Comment   WBC 12.1 (*) 4.0 - 10.5 (K/uL)    RBC 3.99  3.87 - 5.11 (MIL/uL)    Hemoglobin 8.7 (*) 12.0 - 15.0 (g/dL)    HCT 13.2 (*) 44.0 - 46.0 (%)    MCV 67.9 (*) 78.0 - 100.0 (fL)    MCH 21.8 (*) 26.0 - 34.0 (pg)    MCHC 32.1  30.0 - 36.0 (g/dL)    RDW 10.2 (*) 72.5 - 15.5 (%)    Platelets 513 (*) 150 - 400 (K/uL)   CBC     Status: Abnormal   Collection Time   03/07/12  2:57 AM      Component Value Range Comment   WBC 12.2 (*) 4.0 - 10.5 (K/uL)    RBC 3.71 (*) 3.87 - 5.11 (MIL/uL)    Hemoglobin 8.0 (*) 12.0 - 15.0 (g/dL)    HCT 36.6 (*) 44.0 - 46.0 (%)    MCV 67.9 (*) 78.0 - 100.0 (fL)    MCH 21.6 (*) 26.0 - 34.0 (pg)    MCHC 31.7  30.0 - 36.0 (g/dL)    RDW 34.7  42.5 - 95.6 (%)    Platelets 598 (*) 150 - 400 (K/uL)   BASIC METABOLIC PANEL     Status: Abnormal   Collection Time   03/07/12  2:57 AM      Component Value Range Comment   Sodium 138  135 - 145 (mEq/L)    Potassium 3.7  3.5 - 5.1 (mEq/L)    Chloride 106  96 - 112 (mEq/L)    CO2 23  19 - 32 (mEq/L)    Glucose, Bld 104 (*) 70 - 99 (mg/dL)    BUN 16  6 - 23 (mg/dL) DELTA CHECK NOTED   Creatinine, Ser 0.96  0.50 - 1.10 (mg/dL)    Calcium 8.5  8.4 - 10.5 (mg/dL)    GFR calc non Af Amer 62 (*) >90 (mL/min)  GFR calc Af Amer 72 (*) >90 (mL/min)   CBC     Status: Abnormal   Collection Time   03/07/12  8:22 AM      Component Value Range Comment   WBC 12.9 (*) 4.0 - 10.5 (K/uL)    RBC 3.71 (*) 3.87 - 5.11 (MIL/uL)    Hemoglobin 8.0 (*) 12.0 - 15.0 (g/dL)    HCT 16.1 (*) 09.6 - 46.0 (%)    MCV 67.7 (*) 78.0  - 100.0 (fL)    MCH 21.6 (*) 26.0 - 34.0 (pg)    MCHC 31.9  30.0 - 36.0 (g/dL)    RDW 04.5 (*) 40.9 - 15.5 (%)    Platelets 562 (*) 150 - 400 (K/uL)   RETICULOCYTES     Status: Abnormal   Collection Time   03/07/12 10:27 AM      Component Value Range Comment   Retic Ct Pct 1.7  0.4 - 3.1 (%)    RBC. 3.64 (*) 3.87 - 5.11 (MIL/uL)    Retic Count, Manual 61.9  19.0 - 186.0 (K/uL)      Medication List  As of 03/07/2012  1:56 PM   TAKE these medications         ferrous fumarate 325 (106 FE) MG Tabs   Commonly known as: HEMOCYTE - 106 mg FE   Take 1 tablet (106 mg of iron total) by mouth 2 (two) times daily.      irbesartan 300 MG tablet   Commonly known as: AVAPRO   Take 300 mg by mouth every morning.      pantoprazole 40 MG tablet   Commonly known as: PROTONIX   Take 1 tablet (40 mg total) by mouth 2 (two) times daily.      simvastatin 40 MG tablet   Commonly known as: ZOCOR   Take 40 mg by mouth every morning.      TRILIPIX 135 MG capsule   Generic drug: Choline Fenofibrate   Take 135 mg by mouth daily.            History of present illness: 61 year old female presented to the emergency department with complaints of generalized weakness with melena for 2 days. Last endoscopic evaluation in 2003 by Dr. Loreta Ave at that time the patient was found to have diffuse hemorrhagic gastritis but no ulcers. Vascular that time was negative. Denied use of any type of nonsteroidal anti-inflammatory medication. History was limited by the patient speaking limited [place] and only [place] Hemoglobin from April 2012 was 8.4 with chronically low MCV around 65. She was normotensive at presentation and exam was otherwise unremarkable. Her BUN was 37 with a creatinine of 1.12 and hemoglobin was 9.7 with an MCV of 67. All other diagnostic evaluation and a MRSA is normal.  Hospital Course: Principal Problem:  *Upper GIB (gastrointestinal bleeding) Active Problems:  Anemia  Peptic ulcer  disease  Weakness generalized  HTN (hypertension)  Dyslipidemia  Upper GIB (gastrointestinal bleeding)/ Peptic ulcer disease Gastroenterology was consulted during this admission. Dr. Delfin Gant took the patient for upper endoscopy on 03/06/2012 which revealed multiple old ulcers but no active bleeding. Patient will continue on Protonix after discharge and is to followup with Dr. Loreta Ave in the office in 2-4 weeks she needs to call for an appointment. She has been given discharge instructions in [place]  Chronic microcytic Anemia Hemoglobin was 9.7 at admission. By the date of discharge hemoglobin has drifted to 8.7 which is actually the patient's baseline range. An anemia panel  reveals iron deffiency we will initiate iron therapy.  HTN (hypertension Has remained hemodynamically stable this admission and her Avapro was continued  Dyslipidemia Resume Zocor at discharge  Day of Discharge BP 119/71  Pulse 93  Temp(Src) 98.6 F (37 C) (Oral)  Resp 15  Ht 5\' 3"  (1.6 m)  Wt 60.5 kg (133 lb 6.1 oz)  BMI 23.63 kg/m2  SpO2 99%  Physical Exam:  General appearance: alert, cooperative, appears stated age and no distress Resp: clear to auscultation bilaterally Cardio: regular rate and rhythm, S1, S2 normal, no murmur, click, rub or gallop GI: soft, non-tender; bowel sounds normal; no masses,  no organomegaly Extremities: extremities normal, atraumatic, no cyanosis or edema Neurologic: Grossly normal  Follow-up: Call Dr. Kenna Gilbert office to be seen in 2-4 weeks for followup regarding her ulcers  Disposition:  Discharge to home via private vehicle   Junious Silk, ANP pager [phone]   I have examined the patient and reviewed the chart. I agree with the above note.   Calvert Cantor, MD [phone]

## 2012-03-07 NOTE — Discharge Instructions (Signed)
Ch?y Mu D? Dy-Ru?t  (Gastrointestinal Bleeding) Ch?y mu d? dy-ru?t (GI) l hi?n t??ng ch?y mu t? ru?t ho?c b?t k? n?i no gi?a mi?ng v h?u mn c?a b?n. N?u ch?y mu ch?m, b?n c th? ???c cho v? nh. N?u ch?y mu nhi?u, b?n th??ng ???c yu c?u n?m vi?n v theo di.  TRI?U CH?NG  B?n nn ra mu ?? t??i ho?c c ch?t gi?ng nh? b?t c ph.   B?n ?i ngoi ra mu ho?c phn c mu ?en v h?c n.  CH?N ?ON Chuyn gia ch?m [place] y t? c?a b?n c th? ch?n ?on tnh tr?ng c?a b?n b?ng cch xem b?nh s? v khm tr?c ti?p. C th? c?n thm cc xt nghi?m khc, bao g?m:  X-quang.   EGD (n?i soi th?c qu?n d? dy) ?? ki?m tra th?c qu?n, d? dy v ru?t non thng qua d?ng c? gi?ng nh? knh thin v?n d?o.   Soi ru?t k?t ?? ki?m tra ru?t k?t/ru?t gi thng qua m?t d?ng c? gi?ng nh? knh thin v?n d?o.   Sinh thi?t, xt nghi?m c?t m?t ph?n nh? c?a m ?? soi d??i knh hi?n vi.  Tm hi?u k?t qu? xt nghi?m Khng ph?i m?i k?t qu? ??u c s?n trong l?n khm c?a b?n. N?u k?t qu? xt nghi?m c?a b?n khng ???c tr? l?i trong l?n khm c?a b?n, hy h?n v?i chuyn gia ch?m [place] y t? ?? tm hi?u k?t qu?Imagene Sheller cho m?i th? l bnh th??ng n?u b?n khng th?y chuyn gia ch?m [place] y t? ho?c c? s? y t? ni g. ?i?u quan tr?ng ??i v?i b?n l theo di m?i k?t qu? xt nghi?m c?a b?n. H??NG D?N CH?M [place] T?I NH  Th?c hi?n theo h??ng d?n nh? ?? xu?t b?i chuyn gia ch?m [place] y t? c?a b?n lin quan ??n thu?c. Khng dng aspirin, r??u ho?c thu?c gi?m ?au v thu?c tr? vim kh?p, tr? khi chuyn gia ch?m [place] y t? c?a b?n ni b?n c th? lm nh? v?y.   Nh?n s? ch?m  theo sau khi lm xong cc xt nghi?m ny, theo ?? ngh?.  HY ??N KHM B?NH NGAY L?P T?C N?U:  B?n ngy cng ch?y mu nhi?u ho?c b?n b?t ??u h?i ?au ??u, suy nh??c ho?c c nh?ng lc u? o?i.   B?n b? ?au th?t nghim tr?ng trong d? dy, ? l?ng ho?c trong b?ng.   B?n ?i ra nh?ng c?c mu l?n.   B?nh khi?n b?n lc ??u ph?i ?i khm ngy cng tr?m tr?ng h?n.  HY CH?C CH?N R?NG  B?N:  Hi?u r nh?ng h??ng d?n khi xu?t vi?n.   S? theo di tnh tr?ng b?nh c?a b?n.   S? ??n khm b?nh ngay l?p t?c nh? ? ???c h??ng d?n.  Document Released: 11/26/2005 Document Revised: 11/15/2011 [facility] Patient Information 2012 [place], [place].Lot ???ng Tiu Ha (Ulcers of the Gastrointestinal Tract) B?n b? lot hay c th? th??ng b? lot h?n h?u h?t m?i ng??i. Lot l ch? n?t ra hay l l? th?ng ? l?p nim m?c c?a th?c qu?n (?ng d?n th?c ?n t? mi?ng ??n d? dy), d? dy, hay ph?n ??u c?a ru?t non. NGUYN NHN  Vi trng (vi khu?n). C m?t lo?i vi khu?n lin quan ??n lot ???c g?i l Helicobacter Pylori.   D??c ph?m nh? cc thu?c khng vim khng steroid.   Ht thu?c l c lin quan ??n lot v n khng gip v?t lot lnh l?i.  TRI?U CH?NG  Nng rt hay  c?n co ? vng th??ng v? (b?ng). Tri?u ch?ng ny th??ng gi?m ?i khi ?n ho?c u?ng thu?c lm gi?m ?? axit trong d? dy.   C?m gic nn nao trong d? dy (bu?n nn).   Ph.   i m?a.   N?u v?t lot d?n ??n ch?y mu, n c th? gy ra:   ?i tiu phn ?en.   i ra mu ?? t??i.   Khi ch?y mu n?ng, c th?:   Hn m v s?c.   i ra ch?t gi?ng b c ph.  CH?N ?ON Vi?c nghin c?u ?i?u g khng ?n (ch?n ?on) th??ng ???c th?c hi?n d?a vo X quang (ch?p X quang v?i ch?t c?n quang) v n?i soi ???ng tiu ha trn. V?i vi?c n?i soi, m?t ?ng soi m?m ???c s? d?ng ?? quan st th?c qu?n, d? dy, v ru?t non. Nh?ng v? tr b?t th??ng c th? ???c sinh thi?t. Sinh thi?t l l?y m?t m?u m nh? ?em kh?o st d??i knh hi?n vi. ? nh?ng ng??i tr? tu?i, c th? ?i?u tr? m?t cch an ton lot ???ng tiu ha m khng c?n th?c hi?n cc xt nghi?m (v d? nh? ch?p X quang v?i ch?t c?n quang) v nn ti?n hnh cc xt nghi?m ch?n ?on ? nh?ng b?nh nhn khng ?p ?ng v?i ?i?u tr?. ?I?U TR?  Trnh ht thu?c l, u?ng r??u, v cc th?c ?n m c v? nh? lm c?n ?au tr?m tr?ng h?n. Ht thu?c l s? lm ch?m l?i qu trnh lnh v?t lot.   S? d?ng cc lo?i thu?c khc  nh? ch? d?n. Bc s? c th? k ??n cc lo?i thu?c ???c bi?t nh? thu?c ?c ch? H2 lm gi?m qu trnh s?n sinh ra ch?t axit. ?y l nh?ng d??c ph?m nh? cimetidine, ranitidine, famotidine, v cc thu?c khc. Cc thu?c khc c th? mua ???c ?? gip b?o v? l?p nim m?c ru?t.   Ti?p t?c lm vi?c v ho?t ??ng bnh th??ng tr? khi ???c ch? th? khc c?a Bc s?.   N?u cc ph??ng php ?i?u tr? ? lot thng th??ng th?t b?i, hy h?i Bc s? vi?c cn nh?c s? d?ng thu?c khng sinh. ?y l cc lo?i thu?c gi?t vi trng.  HY NGAY L?P T?C THAM V?N V?I CHUYN GIA Y T? N?U: Xu?t hi?n cc tri?u ch?ng  Ch?y mu ?? t??i.   i ra mu.   Chong vng, y?u m?t.   B? ng?t.   V m? hi, l?nh v ?m ??t.   ?au b?ng d? d?i, khng gi?m khi dng thu?c. Khng ???c u?ng thu?c gi?m ?au tr? khi theo ch? th? c?a Bc s?.  Document Released: 11/26/2005 Document Revised: 11/15/2011 [employer] Patient Information 2012 [employer], LThi?u S?t B?nh thi?u mu (Iron Deficiency Anemia) C r?t nhi?u lo?i thi?u mu. Thi?u mu do thi?u s?t l ph? bi?n nh?t. Thi?u mu do thi?u s?t l tnh tr?ng gi?m s? l??ng h?ng huy?t c?u gy ra b?i s?t qu t. N?u khng c ?? ch?t s?t, c? th? khng s?n xu?t ?? huy?t c?u t?. Huy?t c?u t? l m?t ch?t trong cc h?ng huy?t c?u mang -xy ??n cc m c?a c? th?. Thi?u mu do thi?u s?t c th? ?? khi?n b?n m?t m?i v kh th?. NGUYN NHN   Thi?u s?t trong ch? ?? ?n u?ng.   Tnh tr?ng ny c th? xu?t hi?n ? tr? s? sinh v tr? em, do c r?t t s?t trong s?a.   Tnh tr?ng ny c th? xu?t hi?n ?  ng??i l?n khng ?n ?? lo?i th?c ph?m giu ch?t s?t.   Tnh tr?ng ny c th? xu?t hi?n ? ph? n? mang thai ho?c cho con b khng u?ng b? sung s?t. C?n nhi?u s?t h?n r?t nhi?u trong nh?ng th?i ?i?m ny.   H?p thu s?t km, nh? x?y ra v?i r?i lo?n ???ng ru?t, ch?ng h?n nh? ph?u thu?t c?t b? ru?t non ho?c b? qua ???ng ru?t.   Ch?y mu ???ng ru?t.   K? kinh nguy?t n?ng.  TRI?U CH?NG Thi?u mu nh? c th? khng ???c nh?n ra. Tri?u  ch?ng c th? bao g?m:  M?t m?i.   ?au ??u.   Da nh?t nh?t.   Y?u.   Th? d?c.   Hoa m?t.   L?nh tay v chn.   Nh?p tim nhanh ho?c khng ??u.  CH?N ?ON Ch?n ?on ?i h?i ph?i c m?t ?nh gi k? l??ng v khm tr?c ti?p b?i chuyn gia ch?m [place] y t?.  Xt nghi?m mu th??ng ???c dng ?? xc nh?n thi?u mu do thi?u s?t.   Cc xt nghi?m b? sung c th? ???c th?c hi?n ?? tm ra nguyn nhn c? b?n c?a tnh tr?ng thi?u mu. V?n ?? ny c th? bao g?m:   Xt nghi?m mu trong phn (xt nghi?m mu ?n trong phn).   M?t th? thu?t ?? xem bn trong ??i trng v tr?c trng (soi ru?t k?t).   M?t th? thu?t ?? xem bn trong th?c qu?n v d? dy (n?i soi).  ?I?U TR?  Kh?c ph?c cc nguyn nhn gy thi?u s?t l b??c ??u tin.   Thu?c, ch?ng h?n nh? thu?c trnh thai dng ?? u?ng, c th? lm cho kinh nguy?t n?ng ch?y t h?n.   Thu?c khng sinh v cc lo?i thu?c khc c th? ???c s? d?ng ?? ?i?u tr? lot d? dy.   C th? c?n ph?u thu?t ?? c?t b? polip ch?y mu, kh?i u ho?c u x? t? cung.   Thng th??ng, ch?t b? sung s?t (s?t II sulfat) ???c s? d?ng.   ?? h?p thu s?t t?t nh?t, hy s? d?ng cc ch?t b? sung khi ?i.   B?n c th? c?n s? d?ng cc ch?t b? sung cng v?i th?c ?n n?u b?n khng th? ch?u ???c chng khi ?i. Vitamin C gip c?i thi?n kh? n?ng h?p thu s?t. Chuyn gia ch?m Hart y t? c th? khuyn b?n nn u?ng vin s?t v?i m?t ly n??c cam ho?c ch?t b? sung vitamin C.   S?a v cc thu?c khng acid khng nn ???c s? d?ng cng lc v?i cc ch?t b? sung s?t. Chng c th? ?nh h??ng ??n kh? n?ng h?p thu s?t.   Ch?t b? sung s?t c th? gy to bn. Thu?c lm m?m phn th??ng ???c khuy?n co.   Ph? n? mang thai v cho con b s? c?n b? sung s?t, v ch? ?? ?n u?ng bnh th??ng c?a h? th??ng s? khng cung c?p ?? s? l??ng yu c?u.   B?nh nhn khng th? ch?u ???c s?t qua ???ng mi?ng c th? s? d?ng n thng qua m?t t?nh m?ch (trong t?nh m?ch) ho?c b?ng cch tim vo c?.  H??NG D?N CH?M [place] T?I NH  Chuyn gia  dinh d??ng ho?c bc s? c?a b?n c th? gip b?n tr? l?i nh?ng th?c m?c v? ch? ?? ?n u?ng c?a b?n.   U?ng vitamin v s?t theo ??n c?a bc s?.   p d?ng ch? ?? ?n u?ng giu ch?t s?t. ?  n gan, th?t b n?c, bnh m nguyn h?t, tr?ng, tri cy kh v rau xanh s?m, nhi?u l.  HY NGAY L?P T?C THAM V?N V?I CHUYN GIA Y T? N?U:  B?n b? chong. KHNG T? LI XE. G?i kh?n c?p c?a C?c ??a ph??ng (911 t?i [place]?) n?u khng c ng??i gip ??.   B?n b? ?au ng?c, bu?n nn ho?c nn m?a.   B?n c hi?n t??ng th? g?p nghim tr?ng v ngy cng t?ng khi ho?t ??ng.   B?n c?m th?y y?u ho?c kht n??c gia t?ng.   B?n c nh?p tim ??p nhanh.   B?n b?t ??u tot m? hi khng r nguyn nhn, ho?c b? hoa m?t khi ??ng d?y kh?i gi??ng ho?c gh?.  ??M B?O B?N:   Hi?u cc h??ng d?n ny.   S? theo di tnh tr?ng c?a mnh.   S? yu c?u tr? gip ngay l?p t?c n?u b?n c?m th?y khng kh?e ho?c tnh tr?ng tr? nn t?i t? h?n.  Document Released: 11/26/2005 Document Revised: 11/15/2011 [facility] Patient Information 2012 [employer].

## 2012-03-09 NOTE — ED Provider Notes (Signed)
Medical screening examination/treatment/procedure(s) were performed by non-physician practitioner and as supervising physician I was immediately available for consultation/collaboration.  Raeford Razor, MD 03/09/12 [phone]

## 2012-03-10 ENCOUNTER — Encounter (HOSPITAL_COMMUNITY): Payer: Self-pay | Admitting: Gastroenterology

## 2012-05-16 ENCOUNTER — Other Ambulatory Visit: Payer: Self-pay | Admitting: Family Medicine

## 2012-05-16 ENCOUNTER — Other Ambulatory Visit (HOSPITAL_COMMUNITY)
Admission: RE | Admit: 2012-05-16 | Discharge: 2012-05-16 | Disposition: A | Discharge status: Home / Self Care | Payer: BC Managed Care – PPO | Source: Ambulatory Visit | Attending: Family Medicine | Admitting: Family Medicine

## 2012-05-16 DIAGNOSIS — Z01419 Encounter for gynecological examination (general) (routine) without abnormal findings: Secondary | ICD-10-CM | POA: Insufficient documentation

## 2012-05-19 ENCOUNTER — Other Ambulatory Visit (HOSPITAL_COMMUNITY): Payer: Self-pay | Admitting: Family Medicine

## 2012-05-19 DIAGNOSIS — Z1231 Encounter for screening mammogram for malignant neoplasm of breast: Secondary | ICD-10-CM

## 2012-05-19 DIAGNOSIS — N951 Menopausal and female climacteric states: Secondary | ICD-10-CM

## 2012-06-24 ENCOUNTER — Ambulatory Visit (HOSPITAL_COMMUNITY)
Admission: RE | Admit: 2012-06-24 | Discharge: 2012-06-24 | Disposition: A | Discharge status: Home / Self Care | Payer: Self-pay | Source: Ambulatory Visit | Attending: Family Medicine | Admitting: Family Medicine

## 2012-06-24 DIAGNOSIS — Z13820 Encounter for screening for osteoporosis: Secondary | ICD-10-CM | POA: Insufficient documentation

## 2012-06-24 DIAGNOSIS — N951 Menopausal and female climacteric states: Secondary | ICD-10-CM

## 2012-06-24 DIAGNOSIS — Z780 Asymptomatic menopausal state: Secondary | ICD-10-CM | POA: Insufficient documentation

## 2012-06-24 DIAGNOSIS — Z1231 Encounter for screening mammogram for malignant neoplasm of breast: Secondary | ICD-10-CM

## 2012-09-28 ENCOUNTER — Emergency Department (HOSPITAL_COMMUNITY): Payer: Self-pay

## 2012-09-28 ENCOUNTER — Encounter (HOSPITAL_COMMUNITY): Payer: Self-pay | Admitting: Nurse Practitioner

## 2012-09-28 ENCOUNTER — Emergency Department (HOSPITAL_COMMUNITY)
Admission: EM | Admit: 2012-09-28 | Discharge: 2012-09-28 | Disposition: A | Discharge status: Home / Self Care | Payer: Self-pay | Attending: Emergency Medicine | Admitting: Emergency Medicine

## 2012-09-28 DIAGNOSIS — I10 Essential (primary) hypertension: Secondary | ICD-10-CM | POA: Insufficient documentation

## 2012-09-28 DIAGNOSIS — R109 Unspecified abdominal pain: Secondary | ICD-10-CM

## 2012-09-28 DIAGNOSIS — N949 Unspecified condition associated with female genital organs and menstrual cycle: Secondary | ICD-10-CM | POA: Insufficient documentation

## 2012-09-28 DIAGNOSIS — R10819 Abdominal tenderness, unspecified site: Secondary | ICD-10-CM | POA: Insufficient documentation

## 2012-09-28 DIAGNOSIS — R102 Pelvic and perineal pain: Secondary | ICD-10-CM

## 2012-09-28 LAB — URINALYSIS, MICROSCOPIC ONLY
Ketones, ur: NEGATIVE mg/dL
Leukocytes, UA: NEGATIVE
Nitrite: NEGATIVE
Protein, ur: NEGATIVE mg/dL

## 2012-09-28 LAB — CBC WITH DIFFERENTIAL/PLATELET
Basophils Relative: 1 % (ref 0–1)
Eosinophils Absolute: 0.2 10*3/uL (ref 0.0–0.7)
HCT: 38.6 % (ref 36.0–46.0)
Hemoglobin: 12.1 g/dL (ref 12.0–15.0)
Lymphs Abs: 1.5 10*3/uL (ref 0.7–4.0)
MCH: 21.3 pg — ABNORMAL LOW (ref 26.0–34.0)
MCHC: 31.3 g/dL (ref 30.0–36.0)
Monocytes Absolute: 0.3 10*3/uL (ref 0.1–1.0)
Monocytes Relative: 5 % (ref 3–12)
Neutrophils Relative %: 71 % (ref 43–77)
RBC: 5.67 MIL/uL — ABNORMAL HIGH (ref 3.87–5.11)

## 2012-09-28 LAB — COMPREHENSIVE METABOLIC PANEL
Albumin: 4.2 g/dL (ref 3.5–5.2)
Alkaline Phosphatase: 87 U/L (ref 39–117)
BUN: 16 mg/dL (ref 6–23)
Chloride: 103 mEq/L (ref 96–112)
Creatinine, Ser: 1.1 mg/dL (ref 0.50–1.10)
GFR calc Af Amer: 61 mL/min — ABNORMAL LOW (ref >90)
Glucose, Bld: 97 mg/dL (ref 70–99)
Potassium: 4.8 mEq/L (ref 3.5–5.1)
Total Bilirubin: 0.4 mg/dL (ref 0.3–1.2)
Total Protein: 8.2 g/dL (ref 6.0–8.3)

## 2012-09-28 NOTE — ED Notes (Signed)
Through [place]etnamese intepreter phone: lower abd pain x 3 weeks. No bowel/blader changes, no n/v. No past history.

## 2012-09-28 NOTE — ED Notes (Signed)
Patient transported to Ultrasound 

## 2012-09-28 NOTE — ED Provider Notes (Signed)
History     CSN: [id number]  Arrival date & time 09/28/12  1102   First MD Initiated Contact with Patient 09/28/12 1630      Chief Complaint  Patient presents with  . Abdominal Pain    (Consider location/radiation/quality/duration/timing/severity/associated sxs/prior treatment) HPI Comments: 61 y/o [place] female presents to the ED with her husband complaining of gradual onset lower abdominal pain x 3 weeks. History obtained through interpreter phone. States pain is constant and non-radiating. She has not tried any alleviating factors. Denies nausea, vomiting, appetite change, vaginal bleeding or discharge, fever, chills, back pain. She is having normal bowel movements. Had similar pain 5 years ago and was told nothing was wrong with her by the "normal doctor". She then went to the "woman doctor" who told her it is because she is getting old and her organs are changing. Non smoker and denies any alcohol intake.   Patient is a 61 y.o. female presenting with abdominal pain. The history is provided by the patient. The history is limited by a language barrier. A language interpreter was used.  Abdominal Pain The primary symptoms of the illness include abdominal pain. The primary symptoms of the illness do not include fever, fatigue, shortness of breath, nausea, vomiting, diarrhea, vaginal discharge or vaginal bleeding.  Symptoms associated with the illness do not include chills, constipation, urgency or frequency.    Past Medical History  Diagnosis Date  . Gastritis   . Hypertension   . Hyperlipemia   . Iron deficiency anemia     Past Surgical History  Procedure Date  . Cesarean section   . Esophagogastroduodenoscopy 03/06/2012    Procedure: ESOPHAGOGASTRODUODENOSCOPY (EGD);  Surgeon: Theda Belfast, MD;  Location: [facility] ENDOSCOPY;  Service: Endoscopy;  Laterality: N/A;  . Hot hemostasis 03/06/2012    Procedure: HOT HEMOSTASIS (ARGON PLASMA COAGULATION/BICAP);  Surgeon: Theda Belfast, MD;  Location: [facility] ENDOSCOPY;  Service: Endoscopy;;    History reviewed. No pertinent family history.  History  Substance Use Topics  . Smoking status: Never Smoker   . Smokeless tobacco: Not on file  . Alcohol Use: No    OB History    Grav Para Term Preterm Abortions TAB SAB Ect Mult Living                  Review of Systems  Constitutional: Negative for fever, chills, appetite change, fatigue and unexpected weight change.  HENT: Negative for neck pain and neck stiffness.   Respiratory: Negative for shortness of breath.   Cardiovascular: Negative for chest pain.  Gastrointestinal: Positive for abdominal pain. Negative for nausea, vomiting, diarrhea and constipation.  Genitourinary: Negative for urgency, frequency, vaginal bleeding, vaginal discharge and difficulty urinating.  Skin: Negative for pallor.  Neurological: Negative for dizziness, weakness, light-headedness and headaches.  Psychiatric/Behavioral: Negative for confusion.    Allergies  Review of patient's allergies indicates no known allergies.  Home Medications   Current Outpatient Rx  Name Route Sig Dispense Refill  . CHOLINE FENOFIBRATE 135 MG PO CPDR Oral Take 135 mg by mouth daily.    Marland Kitchen ESOMEPRAZOLE MAGNESIUM 40 MG PO CPDR Oral Take 40 mg by mouth daily before breakfast.    . FERROUS FUMARATE 325 (106 FE) MG PO TABS Oral Take 1 tablet (106 mg of iron total) by mouth 2 (two) times daily. 60 each 0  . OLMESARTAN MEDOXOMIL 40 MG PO TABS Oral Take 40 mg by mouth daily.      BP 140/95  Pulse 80  Temp 98 F (36.7 C) (Oral)  Resp 20  SpO2 98%  Physical Exam  Constitutional: She is oriented to person, place, and time. She appears well-developed and well-nourished. No distress.  HENT:  Head: Normocephalic and atraumatic.  Mouth/Throat: Oropharynx is clear and moist.  Eyes: Conjunctivae normal and EOM are normal.  Neck: Normal range of motion. Neck supple.  Cardiovascular: Normal rate, regular rhythm,  normal heart sounds and intact distal pulses.   Pulmonary/Chest: Effort normal and breath sounds normal.  Abdominal: Soft. Normal appearance and bowel sounds are normal. She exhibits no distension and no mass. There is tenderness in the epigastric area, periumbilical area and suprapubic area. There is no rigidity, no rebound, no guarding and no CVA tenderness.  Musculoskeletal: Normal range of motion. She exhibits no edema.  Neurological: She is alert and oriented to person, place, and time.  Skin: Skin is warm and dry.  Psychiatric: She has a normal mood and affect. Her behavior is normal.    ED Course  Procedures (including critical care time)  Labs Reviewed  CBC WITH DIFFERENTIAL - Abnormal; Notable for the following:    RBC 5.67 (*)     MCV 68.1 (*)     MCH 21.3 (*)     RDW 16.1 (*)     Platelets 641 (*)     All other components within normal limits  COMPREHENSIVE METABOLIC PANEL - Abnormal; Notable for the following:    AST 61 (*)     ALT 68 (*)     GFR calc non Af Amer 52 (*)     GFR calc Af Amer 61 (*)     All other components within normal limits  URINALYSIS, MICROSCOPIC ONLY   US Abdomen Complete  09/28/2012  *RADIOLOGY REPORT*  Clinical Data:  Abdominal pain  COMPLETE ABDOMINAL ULTRASOUND  Comparison:  CT scan 08/09/2006  Findings:  Gallbladder:  No gallstones, gallbladder wall thickening, or pericholecystic fluid. No sonographic Murphy's sign.  Common bile duct:  Measures 4.8 mm in diameter within normal limits.  Liver:  No focal lesion identified.  Within normal limits in parenchymal echogenicity. No intrahepatic biliary ductal dilatation.  IVC:  Appears normal.  Pancreas:  Not visualized due to abundant bowel gas.  Spleen:  Measures 7 cm in length.  Normal echogenicity.  Right Kidney:  Measures 9.5 cm in length.  No hydronephrosis, mass or diagnostic renal calculus.  Left Kidney:  Measures 9.8 cm in length.  No hydronephrosis, mass or diagnostic renal calculus.  Mild  bilateral renal thinning of the cortex probable due to mild atrophy.  Abdominal aorta:  No aneurysm identified. Measures up to 2.7 cm in diameter.  IMPRESSION: Negative abdominal ultrasound.   Original Report Authenticated By: Natasha Mead, M.D.    US Transvaginal Non-ob  09/28/2012  *RADIOLOGY REPORT*  Clinical Data: Abdominal and pelvic pain.  TRANSABDOMINAL AND TRANSVAGINAL ULTRASOUND OF PELVIS Technique:  Both transabdominal and transvaginal ultrasound examinations of the pelvis were performed. Transabdominal technique was performed for global imaging of the pelvis including uterus, ovaries, adnexal regions, and pelvic cul-de-sac.  It was necessary to proceed with endovaginal exam following the transabdominal exam to visualize the uterus, endometrium and bilateral adnexa.  Comparison:  Abdominal ultrasound 09/28/2012  Findings:  Uterus: Uterus measures 4.1 x 2.5 x 3.8 cm. No gross abnormality.  Endometrium: Small amount of fluid within the endometrial cavity measuring 0.4 x 0.3 x 1.4 cm. The endometrium is difficult to evaluate due to the fluid but it is does  not appear to be markedly thickened.  Right ovary:  Not visualized.  Left ovary: Not visualized.  Other findings: No free fluid  IMPRESSION: Small amount of fluid within the endometrial cavity.  Findings are nonspecific but could be related to the cervical stenosis.  Ovarian tissue is not visualized.   Original Report Authenticated By: Richarda Overlie, M.D.    US Pelvis Complete  09/28/2012  *RADIOLOGY REPORT*  Clinical Data: Abdominal and pelvic pain.  TRANSABDOMINAL AND TRANSVAGINAL ULTRASOUND OF PELVIS Technique:  Both transabdominal and transvaginal ultrasound examinations of the pelvis were performed. Transabdominal technique was performed for global imaging of the pelvis including uterus, ovaries, adnexal regions, and pelvic cul-de-sac.  It was necessary to proceed with endovaginal exam following the transabdominal exam to visualize the uterus,  endometrium and bilateral adnexa.  Comparison:  Abdominal ultrasound 09/28/2012  Findings:  Uterus: Uterus measures 4.1 x 2.5 x 3.8 cm. No gross abnormality.  Endometrium: Small amount of fluid within the endometrial cavity measuring 0.4 x 0.3 x 1.4 cm. The endometrium is difficult to evaluate due to the fluid but it is does not appear to be markedly thickened.  Right ovary:  Not visualized.  Left ovary: Not visualized.  Other findings: No free fluid  IMPRESSION: Small amount of fluid within the endometrial cavity.  Findings are nonspecific but could be related to the cervical stenosis.  Ovarian tissue is not visualized.   Original Report Authenticated By: Richarda Overlie, M.D.      1. Abdominal pain   2. Pain, pelvic, female       MDM  61 y/o female presenting with lower abdominal pain x 3 weeks. Abdominal US obtained due to mild tenderness and slightly elevated LFT's, results unremarkable. Pelvic US obtained due to patient pointing to her lower pelvic region when I went to tell her results of abdominal US and said "uterus"- results showing some fluid in endometrial cavity. Ovaries not seen. Will refer to gynecology.         Trevor Mace, PA-C 09/28/12 2026

## 2012-09-28 NOTE — ED Notes (Signed)
Pt c/o abdominal pain in LLQ and RLQ since 3 weeks ago, rated pain 7/10. Pt does not speak English very well. Pt A&Ox4, ambulates independently.

## 2012-09-28 NOTE — ED Notes (Signed)
Pt returned from US

## 2012-09-28 NOTE — ED Provider Notes (Signed)
Medical screening examination/treatment/procedure(s) were performed by non-physician practitioner and as supervising physician I was immediately available for consultation/collaboration.  Artice Bergerson, MD 09/28/12 2315 

## 2013-06-22 ENCOUNTER — Other Ambulatory Visit: Payer: Self-pay | Admitting: Family Medicine

## 2013-06-22 ENCOUNTER — Other Ambulatory Visit (HOSPITAL_COMMUNITY)
Admission: RE | Admit: 2013-06-22 | Discharge: 2013-06-22 | Disposition: A | Discharge status: Home / Self Care | Payer: No Typology Code available for payment source | Source: Ambulatory Visit | Attending: Family Medicine | Admitting: Family Medicine

## 2013-06-22 DIAGNOSIS — Z01419 Encounter for gynecological examination (general) (routine) without abnormal findings: Secondary | ICD-10-CM | POA: Insufficient documentation

## 2013-06-24 ENCOUNTER — Other Ambulatory Visit (HOSPITAL_COMMUNITY): Payer: Self-pay | Admitting: Family Medicine

## 2013-06-24 DIAGNOSIS — Z1231 Encounter for screening mammogram for malignant neoplasm of breast: Secondary | ICD-10-CM

## 2013-07-06 ENCOUNTER — Ambulatory Visit (HOSPITAL_COMMUNITY)
Admission: RE | Admit: 2013-07-06 | Discharge: 2013-07-06 | Disposition: A | Discharge status: Home / Self Care | Payer: No Typology Code available for payment source | Source: Ambulatory Visit | Attending: Family Medicine | Admitting: Family Medicine

## 2013-07-06 DIAGNOSIS — Z1231 Encounter for screening mammogram for malignant neoplasm of breast: Secondary | ICD-10-CM | POA: Insufficient documentation

## 2014-02-27 ENCOUNTER — Encounter (HOSPITAL_COMMUNITY): Payer: Self-pay | Admitting: Emergency Medicine

## 2014-02-27 ENCOUNTER — Emergency Department (HOSPITAL_COMMUNITY): Payer: BC Managed Care – PPO

## 2014-02-27 ENCOUNTER — Emergency Department (HOSPITAL_COMMUNITY)
Admission: EM | Admit: 2014-02-27 | Discharge: 2014-02-28 | Disposition: A | Discharge status: Home / Self Care | Payer: BC Managed Care – PPO | Attending: Emergency Medicine | Admitting: Emergency Medicine

## 2014-02-27 DIAGNOSIS — J069 Acute upper respiratory infection, unspecified: Secondary | ICD-10-CM | POA: Insufficient documentation

## 2014-02-27 DIAGNOSIS — R05 Cough: Secondary | ICD-10-CM | POA: Insufficient documentation

## 2014-02-27 DIAGNOSIS — R002 Palpitations: Secondary | ICD-10-CM | POA: Insufficient documentation

## 2014-02-27 DIAGNOSIS — R059 Cough, unspecified: Secondary | ICD-10-CM | POA: Insufficient documentation

## 2014-02-27 DIAGNOSIS — J3489 Other specified disorders of nose and nasal sinuses: Secondary | ICD-10-CM | POA: Insufficient documentation

## 2014-02-27 DIAGNOSIS — E785 Hyperlipidemia, unspecified: Secondary | ICD-10-CM | POA: Insufficient documentation

## 2014-02-27 DIAGNOSIS — R42 Dizziness and giddiness: Secondary | ICD-10-CM | POA: Insufficient documentation

## 2014-02-27 DIAGNOSIS — Z79899 Other long term (current) drug therapy: Secondary | ICD-10-CM | POA: Insufficient documentation

## 2014-02-27 DIAGNOSIS — I10 Essential (primary) hypertension: Secondary | ICD-10-CM | POA: Insufficient documentation

## 2014-02-27 LAB — BASIC METABOLIC PANEL
BUN: 11 mg/dL (ref 6–23)
CHLORIDE: 104 meq/L (ref 96–112)
CO2: 25 meq/L (ref 19–32)
CREATININE: 0.96 mg/dL (ref 0.50–1.10)
Calcium: 9.3 mg/dL (ref 8.4–10.5)
GFR calc Af Amer: 71 mL/min — ABNORMAL LOW (ref >90)
GFR calc non Af Amer: 61 mL/min — ABNORMAL LOW (ref >90)
GLUCOSE: 106 mg/dL — AB (ref 70–99)
Potassium: 4.9 mEq/L (ref 3.7–5.3)
Sodium: 142 mEq/L (ref 137–147)

## 2014-02-27 LAB — CBC
HEMATOCRIT: 35.3 % — AB (ref 36.0–46.0)
Hemoglobin: 10.8 g/dL — ABNORMAL LOW (ref 12.0–15.0)
MCH: 21.3 pg — AB (ref 26.0–34.0)
MCHC: 30.6 g/dL (ref 30.0–36.0)
MCV: 69.8 fL — AB (ref 78.0–100.0)
Platelets: 597 10*3/uL — ABNORMAL HIGH (ref 150–400)
RBC: 5.06 MIL/uL (ref 3.87–5.11)
RDW: 15.2 % (ref 11.5–15.5)
WBC: 6.4 10*3/uL (ref 4.0–10.5)

## 2014-02-27 LAB — I-STAT TROPONIN, ED: Troponin i, poc: 0 ng/mL (ref 0.00–0.08)

## 2014-02-27 LAB — POC OCCULT BLOOD, ED: Fecal Occult Bld: NEGATIVE

## 2014-02-27 MED ORDER — SODIUM CHLORIDE 0.9 % IV SOLN
INTRAVENOUS | Status: DC
  Administered 2014-02-27: 125 mL/h via INTRAVENOUS

## 2014-02-27 NOTE — ED Notes (Addendum)
Per EMS , pt. From home with complaint of hypertension , generalized weakness and cough, unable to verify how long pt. Have been sick,  Do not speak english.  No SOB. Pt. Was reported of having flu  weeks ago.

## 2014-02-27 NOTE — ED Provider Notes (Signed)
CSN: [id number]     Arrival date & time 02/27/14  2112 History   First MD Initiated Contact with Patient 02/27/14 2129     Chief Complaint  Patient presents with  . Hypertension  . Weakness  . Cough     (Consider location/radiation/quality/duration/timing/severity/associated sxs/prior Treatment) HPI  63 year-old [place] speaking female with history of iron deficiency anemia, hypertension, HLD, gastritis who was brought in via EMS with a chief complaints of generalized weakness.  Hx obtain by me in [place]. Patient states for the past 3 days she has been experiencing flulike symptoms including nasal congestion, coughing, runny nose, mild body aches, and fatigue. Today she was cleaning her house with cleaning agent and afterwards she feels that the fumes from the cleaner was too strong causing her to have persistent sneezing, lightheadedness, dry cough. She also is present heart palpitation, check her blood pressure and noticed that it was elevated. She check several different times and at the highest it was reading 185/115. Patient therefore was concerned and decided to come to the ER for further evaluation. She did take her blood pressure medication regularly. At this time she denies any fever, chills, severe headache, chest pain, diaphoresis, dizziness, and shortness of breath, abdominal pain, dysuria, numbness or rash.  Past Medical History  Diagnosis Date  . Gastritis   . Hypertension   . Hyperlipemia   . Iron deficiency anemia    Past Surgical History  Procedure Laterality Date  . Cesarean section    . Esophagogastroduodenoscopy  03/06/2012    Procedure: ESOPHAGOGASTRODUODENOSCOPY (EGD);  Surgeon: Beryle Beams, MD;  Location: [facility] ENDOSCOPY;  Service: Endoscopy;  Laterality: N/A;  . Hot hemostasis  03/06/2012    Procedure: HOT HEMOSTASIS (ARGON PLASMA COAGULATION/BICAP);  Surgeon: Beryle Beams, MD;  Location: [facility] ENDOSCOPY;  Service: Endoscopy;;   History reviewed. No  pertinent family history. History  Substance Use Topics  . Smoking status: Never Smoker   . Smokeless tobacco: Not on file  . Alcohol Use: No   OB History   Grav Para Term Preterm Abortions TAB SAB Ect Mult Living                 Review of Systems  All other systems reviewed and are negative.      Allergies  Review of patient's allergies indicates no known allergies.  Home Medications   Current Outpatient Rx  Name  Route  Sig  Dispense  Refill  . Choline Fenofibrate (TRILIPIX) 135 MG capsule   Oral   Take 135 mg by mouth daily.         Marland Kitchen esomeprazole (NEXIUM) 40 MG capsule   Oral   Take 40 mg by mouth daily before breakfast.         . ferrous fumarate (HEMOCYTE - 106 MG FE) 325 (106 FE) MG TABS   Oral   Take 1 tablet (106 mg of iron total) by mouth 2 (two) times daily.   60 each   0   . olmesartan (BENICAR) 40 MG tablet   Oral   Take 40 mg by mouth daily.          BP 175/89  Pulse 80  Temp(Src) 98.1 F (36.7 C) (Oral)  Resp 19  SpO2 99% Physical Exam  Nursing note and vitals reviewed. Constitutional: She is oriented to person, place, and time. She appears well-developed and well-nourished. No distress.  Awake, alert, nontoxic appearance  HENT:  Head: Atraumatic.  Right Ear: External ear normal.  Left Ear: External ear normal.  Nose: Nose normal.  Mouth/Throat: Oropharynx is clear and moist.  Eyes: Conjunctivae are normal. Right eye exhibits no discharge. Left eye exhibits no discharge.  Neck: Neck supple.  Cardiovascular: Normal rate and regular rhythm.  Exam reveals no gallop and no friction rub.   No murmur heard. Pulmonary/Chest: Effort normal. No respiratory distress. She has no wheezes. She has no rales. She exhibits no tenderness.  Abdominal: Soft. There is no tenderness. There is no rebound.  Musculoskeletal: She exhibits no edema and no tenderness.  ROM appears intact, no obvious focal weakness  5/5 strength to all 4 extremities   Neurological: She is alert and oriented to person, place, and time. She has normal strength. No cranial nerve deficit or sensory deficit. She displays a negative Romberg sign. Coordination and gait normal. GCS eye subscore is 4. GCS verbal subscore is 5. GCS motor subscore is 6.  Mental status and motor strength appears intact  Skin: No rash noted.  Psychiatric: She has a normal mood and affect.    ED Course  Procedures (including critical care time)  10:12 PM Pt with URI sxs, here with concerns of elevated BP despite taking her meds.  She takes benicar.  Her blood pressure has been elevated here with systolics in the 478G. No obvious complaint concerning for end organ damage, however workup initiated  12:50 AM Patient's evaluation today has been fairly unremarkable she does have evidence of anemia with a hemoglobin of 10.8 however this is higher than her baseline. She has normal WBC. Chest x-ray without any evidence of pleural effusion or signs of infection. Her fecal Hemoccult is negative. She is currently taking an over-the-counter medication for her URI symptoms that has phenylephrine which certainly explain the need tachycardia and elevated blood pressure. Incidentally she was found to be hypoxic with ambulation with O2 in the 70s-80s.  This was checked several times.  However she was completely asymptomatic including no chest pain or shortness of breath. I have consult with Dr. Aline Brochure and also consulted hospitalist Dr. Alcario Drought.  Dr. Alcario Drought felt in light of pt being asymptomatic, having no SOB or CP and no hx of smoking, this finding may be evaluate further by pulmonology.  Will give pt referral.  Pt otherwise felt comfortable discharge.  She agrees to strict return precaution.     Pt understand to avoid any medication which contain phenylephrine or with D in it.  Pt currently with improved BP, and sats at 98% on RA.  Labs Review Labs Reviewed  CBC - Abnormal; Notable for the following:     Hemoglobin 10.8 (*)    HCT 35.3 (*)    MCV 69.8 (*)    MCH 21.3 (*)    Platelets 597 (*)    All other components within normal limits  BASIC METABOLIC PANEL - Abnormal; Notable for the following:    Glucose, Bld 106 (*)    GFR calc non Af Amer 61 (*)    GFR calc Af Amer 71 (*)    All other components within normal limits  Randolm Idol, ED  POC OCCULT BLOOD, ED   Imaging Review Dg Chest Port 1 View  02/27/2014   CLINICAL DATA:  63 year old female with cough.  EXAM: PORTABLE CHEST - 1 VIEW  COMPARISON:  03/09/2011  FINDINGS: The cardiomediastinal silhouette is unremarkable.  There is no evidence of focal airspace disease, pulmonary edema, suspicious pulmonary nodule/mass, pleural effusion, or pneumothorax. No acute bony abnormalities are identified.  IMPRESSION: No active disease.   Electronically Signed   By: Hassan Rowan M.D.   On: 02/27/2014 22:43     EKG Interpretation   Date/Time:  Saturday February 27 2014 21:41:09 EDT Ventricular Rate:  75 PR Interval:  185 QRS Duration: 82 QT Interval:  422 QTC Calculation: 471 R Axis:   25 Text Interpretation:  Sinus rhythm Probable left atrial enlargement No  significant change since last tracing Confirmed by HARRISON  MD, FORREST  ([id number]) on 02/27/2014 11:56:06 PM           MDM   Final diagnoses:  URI (upper respiratory infection)  Hypertension    BP 145/89  Pulse 78  Temp(Src) 98.1 F (36.7 C) (Oral)  Resp 17  SpO2 97%  I have reviewed nursing notes and vital signs. I personally reviewed the imaging tests through PACS system  I reviewed available ER/hospitalization records thought the EMR    Domenic Moras, [place] 02/28/14 0117

## 2014-02-27 NOTE — ED Notes (Signed)
Bed: HY07 Expected date: 02/27/14 Expected time: 8:49 PM Means of arrival: Ambulance Comments: 63 yo F  Hypertension

## 2014-02-28 MED ORDER — PANTOPRAZOLE SODIUM 40 MG PO TBEC: 80.0000 mg | DELAYED_RELEASE_TABLET | Freq: Every day | ORAL | Status: DC

## 2014-02-28 MED ORDER — IRBESARTAN 300 MG PO TABS
300.0000 mg | ORAL_TABLET | Freq: Every day | ORAL | Status: DC
  Filled 2014-02-28: qty 1

## 2014-02-28 MED ORDER — GUAIFENESIN 100 MG/5ML PO LIQD: 100.0000 mg | ORAL | Status: DC | PRN

## 2014-02-28 MED ORDER — ATORVASTATIN CALCIUM 40 MG PO TABS
40.0000 mg | ORAL_TABLET | Freq: Every day | ORAL | Status: DC
  Filled 2014-02-28: qty 1

## 2014-02-28 NOTE — Discharge Instructions (Signed)
Please stop taking any over the counter medication that contain phenylephrine as it can cause heart palpitation and high blood pressure.  Follow up with your doctor for further evaluation of your hypoxia when ambulating, which you may require a pulmonology follow up to determine the cause.  Return if you have any concerns.    Nhi?m Trng Do Vi-Rt (Viral Infections) Nhi?m trng do vi rt c th? gy ra b?i cc lo?i vi rt khc nhau. H?u h?t nhi?m trng vi rt khng nghim tr?ng v t? kh?i. Tuy nhin, m?t s? nhi?m trng c th? gy ra cc tri?u ch?ng nghim tr?ng v c th? d?n ??n cc bi?n ch?ng khc. TRI?U CH?NG Vi rt c th? th??ng xuyn gy ra:  ?au h?ng nh?.  ?au nh?c.  ?au ??u.  S? m?i.  Cc lo?i pht ban khc nhau.  Ch?y n??c m?t.  M?t m?i.  Ho.  ?n khng ngon mi?ng.  Nhi?m trng ???ng tiu ha gy bu?n nn, nn m?a v tiu ch?y. Nh?ng tri?u ch?ng ny khng ph?n ?ng v?i thu?c khng sinh, v nhi?m trng khng gy ra b?i vi khu?n. Tuy nhin, b?n c th? b? nhi?m trng do vi khu?n sau khi nhi?m vi rt. ?y ?i khi ???c g?i l "b?i nhi?m". Cc tri?u ch?ng c?a nhi?m trng do vi khu?n nh? v?y c th? bao g?m:  ?au h?ng c m? v kh nu?t ngy cng t?i t?.  S?ng cc h?ch ? c?.  ?n l?nh v s?t cao ho?c ko di.  ?au ??u d? d?i.  C?m gic ?au ? vng xoang.  C?m gic b? m?t ton thn (kh ch?u) ko di, ?au nh?c c? b?p v m?t m?i.  Lin t?c ho.  Ho ra b?t k? ??m mu vng, xanh ho?c nu. H??NG D?N CH?M [place]  Ch? s? d?ng thu?c khng c?n k toa ho?c thu?c c?n k toa ?? gi?m ?au, gi?m c?m gic kh ch?u, tiu ch?y ho?c h? s?t theo ch? d?n c?a chuyn gia ch?m [place] s?c kh?e.  U?ng ?? n??c v dung d?ch ?? n??c ti?u trong ho?c c mu vng nh?t. ?? u?ng th? thao c th? cung c?p ch?t ?i?n gi?i, ???ng v ?? n??c c gi tr?.  Ngh? ng?i nhi?u v duy tr ch? ?? dinh d??ng thch h?p. C th? dng sp v n??c canh v?i bnh quy gin ho?c g?o. HY NGAY L?P T?C ?I KHM N?U:  B?n b? nh?c  ??u, kh th?, ?au ng?c, ?au c? n?ng ho?c pht ban khc th??ng.  B?n b? nn, tiu ch?y khng ki?m sot ???c, ho?c b?n khng th? gi? l?i ch?t l?ng.  Nhi?t ?? ?o ? mi?ng trn 38,9 C (102 F), khng gi?m sau khi dng thu?c.  Tr? h?n 3 thng tu?i c nhi?t ?? ?o ? tr?c trng l 102 F (38,9 C) ho?c cao h?n.  Tr? 3 thng tu?i ho?c nh? h?n c nhi?t ?? ?o ? tr?c trng l 100,4 F (38 C) ho?c cao h?n. HY CH?C CH?N R?NG B?N:  Hi?u r nh?ng h??ng d?n khi xu?t vi?n.  S? theo di tnh tr?ng b?nh c?a b?n.  S? yu c?u tr? gip ngay l?p t?c n?u b?n khng ?? ho?c tnh tr?ng tr?m tr?ng h?n. Document Released: 11/26/2005 Document Revised: 07/29/2013 [facility] Patient Information 2014 [place], [place].

## 2014-02-28 NOTE — ED Provider Notes (Signed)
Medical screening examination/treatment/procedure(s) were conducted as a shared visit with non-physician practitioner(s) and myself.  I personally evaluated the patient during the encounter.   EKG Interpretation   Date/Time:  Saturday February 27 2014 21:41:09 EDT Ventricular Rate:  75 PR Interval:  185 QRS Duration: 82 QT Interval:  422 QTC Calculation: 471 R Axis:   25 Text Interpretation:  Sinus rhythm Probable left atrial enlargement No  significant change since last tracing Confirmed by Lashawnna Lambrecht  MD, Maryln Eastham  ([id number]) on 02/27/2014 11:56:06 PM      I interviewed and examined the patient. Lungs are CTAB. Cardiac exam wnl. Abdomen soft. Pt appears well on exam, denies cp or sob.  I interpreted/reviewed the labs and/or imaging which were non-contributory.  Pt had desat to 80's w/ brisk walking, but not clinically symptomatic or sob. This was discussed w/ the hospitalist by the PA who felt like outpt workup by pulmonology was reasonable. I agree. Will provide referral and return precautions.   Blanchard Kelch, MD 02/28/14 1540

## 2014-06-23 ENCOUNTER — Other Ambulatory Visit (HOSPITAL_COMMUNITY): Payer: Self-pay | Admitting: Family Medicine

## 2014-06-23 DIAGNOSIS — Z1231 Encounter for screening mammogram for malignant neoplasm of breast: Secondary | ICD-10-CM

## 2014-07-12 ENCOUNTER — Ambulatory Visit (HOSPITAL_COMMUNITY): Payer: No Typology Code available for payment source

## 2014-08-02 ENCOUNTER — Ambulatory Visit (HOSPITAL_COMMUNITY)
Admission: RE | Admit: 2014-08-02 | Discharge: 2014-08-02 | Disposition: A | Discharge status: Home / Self Care | Payer: BC Managed Care – PPO | Source: Ambulatory Visit | Attending: Family Medicine | Admitting: Family Medicine

## 2014-08-02 DIAGNOSIS — Z1231 Encounter for screening mammogram for malignant neoplasm of breast: Secondary | ICD-10-CM | POA: Insufficient documentation

## 2015-07-20 ENCOUNTER — Other Ambulatory Visit (HOSPITAL_COMMUNITY): Payer: Self-pay | Admitting: Family Medicine

## 2015-07-20 DIAGNOSIS — Z1231 Encounter for screening mammogram for malignant neoplasm of breast: Secondary | ICD-10-CM

## 2015-08-09 ENCOUNTER — Ambulatory Visit (HOSPITAL_COMMUNITY)
Admission: RE | Admit: 2015-08-09 | Discharge: 2015-08-09 | Disposition: A | Discharge status: Home / Self Care | Payer: Medicare Other | Source: Ambulatory Visit | Attending: Family Medicine | Admitting: Family Medicine

## 2015-08-09 DIAGNOSIS — Z1231 Encounter for screening mammogram for malignant neoplasm of breast: Secondary | ICD-10-CM | POA: Diagnosis present

## 2016-02-01 DIAGNOSIS — Z719 Counseling, unspecified: Secondary | ICD-10-CM

## 2016-02-01 NOTE — Congregational Nurse Program (Signed)
Congregational Nurse Program Note  Date of Encounter: 02/01/2016  Past Medical History: Past Medical History  Diagnosis Date  . Gastritis   . Hypertension   . Hyperlipemia   . Iron deficiency anemia     Encounter Details:     CNP Questionnaire - 02/01/16 1428    Patient Demographics   Is this a new or existing patient? New   Patient is considered a/an Refugee   Race Asian   Patient Assistance   Location of Patient Assistance Not Applicable   Patient's financial/insurance status Low Income;Medicaid;Medicare   Uninsured Patient No   Patient referred to apply for the following financial assistance Not Applicable   Food insecurities addressed Not Applicable   Transportation assistance No   Assistance securing medications No   Educational health offerings Navigating the healthcare system   Encounter Details   Primary purpose of visit [place]   Was an Emergency Department visit averted? Not Applicable   Does patient have a medical provider? Yes   Patient referred to Not Applicable   Was a mental health screening completed? (GAINS tool) No   Does patient have dental issues? No   Does patient have vision issues? No   Since previous encounter, have you referred patient for abnormal blood pressure that resulted in a new diagnosis or medication change? No   Since previous encounter, have you referred patient for abnormal blood glucose that resulted in a new diagnosis or medication change? No       Patient came to CN office for assistance in completing paperwork for next MD appointment with [facility] scheduled for 02/17/2016 with Dr. Nancy Fetter.  Jake Michaelis RN Congregational Nurse [phone].

## 2016-02-17 ENCOUNTER — Other Ambulatory Visit (HOSPITAL_COMMUNITY)
Admission: RE | Admit: 2016-02-17 | Discharge: 2016-02-17 | Disposition: A | Discharge status: Home / Self Care | Payer: Medicare Other | Source: Ambulatory Visit | Attending: Family Medicine | Admitting: Family Medicine

## 2016-02-17 ENCOUNTER — Other Ambulatory Visit: Payer: Self-pay | Admitting: Family Medicine

## 2016-02-17 DIAGNOSIS — Z124 Encounter for screening for malignant neoplasm of cervix: Secondary | ICD-10-CM | POA: Insufficient documentation

## 2016-02-20 LAB — CYTOLOGY - PAP

## 2016-03-21 DIAGNOSIS — Z719 Counseling, unspecified: Secondary | ICD-10-CM

## 2016-03-21 NOTE — Congregational Nurse Program (Signed)
Congregational Nurse Program Note  Date of Encounter: 03/21/2016  Past Medical History: Past Medical History  Diagnosis Date  . Gastritis   . Hypertension   . Hyperlipemia   . Iron deficiency anemia     Encounter Details:     CNP Questionnaire - 03/21/16 2116    Patient Demographics   Is this a new or existing patient? New   Patient is considered a/an Refugee   Race Asian   Patient Assistance   Location of Patient Assistance Not Applicable   Patient's financial/insurance status Low Income;Medicaid;Medicare   Uninsured Patient No   Patient referred to apply for the following financial assistance Not Applicable   Food insecurities addressed Not Applicable   Transportation assistance No   Assistance securing medications No   Educational health offerings Navigating the healthcare system   Encounter Details   Primary purpose of visit Education/Health Concerns   Was an Emergency Department visit averted? Not Applicable   Does patient have a medical provider? Yes   Patient referred to Not Applicable   Was a mental health screening completed? (GAINS tool) No   Does patient have dental issues? No   Does patient have vision issues? No   Since previous encounter, have you referred patient for abnormal blood pressure that resulted in a new diagnosis or medication change? No   Since previous encounter, have you referred patient for abnormal blood glucose that resulted in a new diagnosis or medication change? No       Pt. Came to CN office for help interpreting lab results she has received in the mail from Dr. Matilde Haymaker at [place] [facility] [place].  Jake Michaelis RN Congregational Nurse.  [phone].

## 2016-04-18 DIAGNOSIS — Z719 Counseling, unspecified: Secondary | ICD-10-CM

## 2016-04-18 NOTE — Congregational Nurse Program (Signed)
Congregational Nurse Program Note  Date of Encounter: 04/18/2016  Past Medical History: Past Medical History  Diagnosis Date  . Gastritis   . Hypertension   . Hyperlipemia   . Iron deficiency anemia     Encounter Details:     CNP Questionnaire - 04/18/16 1848    Patient Demographics   Is this a new or existing patient? Existing   Patient is considered a/an Refugee   Race Asian   Patient Assistance   Location of Patient Assistance Not Applicable   Patient's financial/insurance status Low Income;Medicaid;Medicare   Uninsured Patient No   Patient referred to apply for the following financial assistance Not Applicable   Food insecurities addressed Not Applicable   Transportation assistance No   Assistance securing medications No   Educational health offerings Navigating the healthcare system;Medications;Acute disease   Encounter Details   Primary purpose of visit Education/Health Concerns;Acute Illness/Condition Visit;Navigating the Healthcare System   Was an Emergency Department visit averted? Not Applicable   Does patient have a medical provider? Yes   Patient referred to Not Applicable   Was a mental health screening completed? (GAINS tool) No   Does patient have dental issues? No   Does patient have vision issues? No   Does your patient have an abnormal blood pressure today? No   Since previous encounter, have you referred patient for abnormal blood pressure that resulted in a new diagnosis or medication change? No   Does your patient have an abnormal blood glucose today? No   Since previous encounter, have you referred patient for abnormal blood glucose that resulted in a new diagnosis or medication change? No   Was there a life-saving intervention made? No       Patient came to CN office asking for help in obtaining a spinal x-ray ordered by Dr. Matilde Haymaker her PCP.  Advised patient she does not need appointment but can walk in to [facility] and they will contact  an interpreter.  Reviewed medication orders and side effects of flexoril.  Jake Michaelis, RN, Congregational Nurse [phone].

## 2016-04-19 ENCOUNTER — Other Ambulatory Visit: Payer: Self-pay | Admitting: Family Medicine

## 2016-04-19 ENCOUNTER — Ambulatory Visit
Admission: RE | Admit: 2016-04-19 | Discharge: 2016-04-19 | Disposition: A | Discharge status: Home / Self Care | Payer: Medicare Other | Source: Ambulatory Visit | Attending: Family Medicine | Admitting: Family Medicine

## 2016-04-19 DIAGNOSIS — M5442 Lumbago with sciatica, left side: Secondary | ICD-10-CM

## 2016-05-02 ENCOUNTER — Ambulatory Visit: Payer: Medicare Other | Admitting: Physical Therapy

## 2016-05-09 ENCOUNTER — Encounter: Payer: Self-pay | Admitting: Physical Therapy

## 2016-05-09 ENCOUNTER — Ambulatory Visit: Payer: Medicare Other | Attending: Family Medicine | Admitting: Physical Therapy

## 2016-05-09 DIAGNOSIS — M5442 Lumbago with sciatica, left side: Secondary | ICD-10-CM | POA: Insufficient documentation

## 2016-05-09 DIAGNOSIS — R262 Difficulty in walking, not elsewhere classified: Secondary | ICD-10-CM | POA: Insufficient documentation

## 2016-05-09 DIAGNOSIS — M5441 Lumbago with sciatica, right side: Secondary | ICD-10-CM | POA: Diagnosis not present

## 2016-05-09 NOTE — Therapy (Signed)
[place], [place], [address] Phone: [phone]   Fax:  [phone]  Physical Therapy Treatment  Patient Details  Name: Brianna Camacho MRN: [id number] Date of Birth: 07-01-1951 Referring Provider: V.Y. Nancy Fetter  Encounter Date: 05/09/2016      PT End of Session - 05/09/16 1512    Visit Number 1   Date for PT Re-Evaluation 07/08/16   PT Start Time 1430   PT Stop Time 1525   PT Time Calculation (min) 55 min   Activity Tolerance Patient tolerated treatment well   Behavior During Therapy [facility] for tasks assessed/performed      Past Medical History  Diagnosis Date  . Gastritis   . Hypertension   . Hyperlipemia   . Iron deficiency anemia     Past Surgical History  Procedure Laterality Date  . Cesarean section    . Esophagogastroduodenoscopy  03/06/2012    Procedure: ESOPHAGOGASTRODUODENOSCOPY (EGD);  Surgeon: Beryle Beams, MD;  Location: [facility] ENDOSCOPY;  Service: Endoscopy;  Laterality: N/A;  . Hot hemostasis  03/06/2012    Procedure: HOT HEMOSTASIS (ARGON PLASMA COAGULATION/BICAP);  Surgeon: Beryle Beams, MD;  Location: [facility] ENDOSCOPY;  Service: Endoscopy;;    There were no vitals filed for this visit.      Subjective Assessment - 05/09/16 1448    Subjective Pateint reports having low back pain with pain into the legs for about 6 months, she is unsure of a cause.  X-rays show DDD   Limitations Sitting;Standing   How long can you sit comfortably? 15 minutes   How long can you stand comfortably? 5 minutes   Patient Stated Goals have less pain   Currently in Pain? Yes   Pain Score 5    Pain Location Back   Pain Orientation Lower   Pain Descriptors / Indicators Aching;Sore   Pain Type Chronic pain   Pain Radiating Towards into the buttocks   Pain Onset More than a month ago   Pain Frequency Constant   Aggravating Factors  sitting, standing, bending   Pain Relieving Factors rest   Effect  of Pain on Daily Activities limits ADL's            [facility] PT Assessment - 05/09/16 0001    Assessment   Medical Diagnosis LBP with DDD   Referring Provider V.Y. Sun   Onset Date/Surgical Date 04/20/16   Prior Therapy no   Precautions   Precautions None   Balance Screen   Has the patient fallen in the past 6 months No   Has the patient had a decrease in activity level because of a fear of falling?  No   Is the patient reluctant to leave their home because of a fear of falling?  No   Home Environment   Additional Comments does housework   Prior Function   Level of Independence Independent   Vocation Retired   Leisure no exercise   Mining engineer Comments fwd head, rounded shoulders   ROM / Strength   AROM / PROM / Strength AROM;Strength   AROM   Overall AROM Comments lumbar ROM decreased 25% with increased pain in the lumbar area   Strength   Overall Strength Comments 4-/5 for the LE's with pain in the low back   Flexibility   Soft Tissue Assessment /Muscle Length --  some tightness in the piriformis and ITB   Palpation   Palpation comment she is  very tight in the lumbar paraspinals, she has tenderness in the bilateral SI and buttocks   Special Tests    Special Tests --  negative SLR and slump tests                             PT Education - May 30, 2016 1508    Education provided Yes   Education Details Wms flexion exercises   Person(s) Educated Patient   Methods Explanation;Demonstration;Handout   Comprehension Verbalized understanding          PT Short Term Goals - 05/30/16 1517    PT SHORT TERM GOAL #1   Title independent with initial HEP   Time 1   Period Weeks   Status New           PT Long Term Goals - 30-May-2016 1517    PT LONG TERM GOAL #1   Title understand proper posture and body mechanics   Time 8   Period Weeks   Status New   PT LONG TERM GOAL #2   Title increase lumbar ROM to WNL's   Time 8    Period Weeks   Status New   PT LONG TERM GOAL #3   Title tolerate standing 10 minutes   Time 8   Period Weeks   Status New   PT LONG TERM GOAL #4   Title be able to do her housework without pain > 3/10   Time 8   Period Weeks   Status New   PT LONG TERM GOAL #5   Title decrease pain 50%   Time 8   Period Weeks   Status New               Plan - May 30, 2016 1513    Clinical Impression Statement Patient with LBP with some pain into the buttocks and bilateral legs, x-rays show DDD.  She is unsure of a cause but reports that she has had to decrease the amount of activity she does due to pain.  She has significant spasms in the lumbar area   Rehab Potential Good   PT Frequency 2x / week   PT Duration 8 weeks   PT Treatment/Interventions ADLs/Self Care Home Management;Electrical Stimulation;Moist Heat;Therapeutic exercise;Therapeutic activities;Functional mobility training;Gait training;Ultrasound;Traction;Neuromuscular re-education;Patient/family education;Manual techniques   PT Next Visit Plan slowly add exercises, see if HEP is okay   Consulted and Agree with Plan of Care Patient      Patient will benefit from skilled therapeutic intervention in order to improve the following deficits and impairments:  Decreased activity tolerance, Decreased mobility, Decreased range of motion, Decreased strength, Difficulty walking, Increased muscle spasms, Impaired flexibility, Postural dysfunction, Improper body mechanics, Pain  Visit Diagnosis: Bilateral low back pain with sciatica, sciatica laterality unspecified - Plan: PT plan of care cert/re-cert  Difficulty in walking, not elsewhere classified - Plan: PT plan of care cert/re-cert       G-Codes - May 30, 2016 1527    Functional Assessment Tool Used foto 73%% limitation   Functional Limitation Other PT primary   Other PT Primary Current Status [id number]) At least 60 percent but less than 80 percent impaired, limited or restricted   Other  PT Primary Goal Status [id number]) At least 40 percent but less than 60 percent impaired, limited or restricted      Problem List Patient Active Problem List   Diagnosis Date Noted  . HTN (hypertension) 03/06/2012  . Dyslipidemia 03/06/2012  . Upper  GIB (gastrointestinal bleeding) 03/06/2012  . Weakness generalized 03/06/2012  . Anemia 03/05/2012  . Peptic ulcer disease 03/05/2012    Sumner Boast., PT 05/09/2016, 3:32 PM  [place] [address] [place], [place], [address] Phone: [phone]   Fax:  [phone]  Name: Brianna Camacho MRN: [id number] Date of Birth: Oct 25, 1951

## 2016-05-16 ENCOUNTER — Encounter: Payer: Self-pay | Admitting: Physical Therapy

## 2016-05-16 ENCOUNTER — Ambulatory Visit: Payer: Medicare Other | Attending: Family Medicine | Admitting: Physical Therapy

## 2016-05-16 DIAGNOSIS — M5442 Lumbago with sciatica, left side: Secondary | ICD-10-CM | POA: Insufficient documentation

## 2016-05-16 DIAGNOSIS — R262 Difficulty in walking, not elsewhere classified: Secondary | ICD-10-CM | POA: Diagnosis present

## 2016-05-16 DIAGNOSIS — M5441 Lumbago with sciatica, right side: Secondary | ICD-10-CM | POA: Diagnosis not present

## 2016-05-16 NOTE — Therapy (Signed)
[place], [place], [address] Phone: [phone]   Fax:  [phone]  Physical Therapy Treatment  Patient Details  Name: Brianna Camacho MRN: [id number] Date of Birth: [birth year] Referring Provider: V.Y. Nancy Fetter  Encounter Date: 05/16/2016      PT End of Session - 05/16/16 1431    Visit Number 2   Date for PT Re-Evaluation 07/08/16   PT Start Time 1350   PT Stop Time 1444   PT Time Calculation (min) 54 min   Activity Tolerance Patient tolerated treatment well   Behavior During Therapy [facility] for tasks assessed/performed      Past Medical History  Diagnosis Date  . Gastritis   . Hypertension   . Hyperlipemia   . Iron deficiency anemia     Past Surgical History  Procedure Laterality Date  . Cesarean section    . Esophagogastroduodenoscopy  03/06/2012    Procedure: ESOPHAGOGASTRODUODENOSCOPY (EGD);  Surgeon: Beryle Beams, MD;  Location: [facility] ENDOSCOPY;  Service: Endoscopy;  Laterality: N/A;  . Hot hemostasis  03/06/2012    Procedure: HOT HEMOSTASIS (ARGON PLASMA COAGULATION/BICAP);  Surgeon: Beryle Beams, MD;  Location: [facility] ENDOSCOPY;  Service: Endoscopy;;    There were no vitals filed for this visit.      Subjective Assessment - 05/16/16 1350    Subjective Pt reports that she is doing fine but her back still hurts.   Pain Score 6    Pain Location Back   Pain Orientation Lower                         [facility] Adult PT Treatment/Exercise - 05/16/16 0001    Exercises   Exercises Lumbar   Lumbar Exercises: Stretches   Passive Hamstring Stretch 5 reps;10 seconds   Piriformis Stretch 4 reps;10 seconds   Lumbar Exercises: Machines for Strengthening   Other Lumbar Machine Exercise Rows #10 2x10; Lats #15 2x10    Lumbar Exercises: Standing   Other Standing Lumbar Exercises Chest press with yellow ball 2x10    Lumbar Exercises: Seated   Long Arc Quad on Chair 1 set;Both;10 reps    Lumbar Exercises: Supine   Bridge 10 reps   Other Supine Lumbar Exercises LE on physo ball HS curls, obliques    Modalities   Modalities Electrical Stimulation;Moist Heat   Moist Heat Therapy   Number Minutes Moist Heat 15 Minutes   Moist Heat Location Lumbar Spine   Electrical Stimulation   Electrical Stimulation Location Lumbar spine    Electrical Stimulation Action IFC   Electrical Stimulation Parameters supine   Electrical Stimulation Goals Pain                  PT Short Term Goals - 05/16/16 1436    PT SHORT TERM GOAL #1   Title independent with initial HEP   Status Achieved           PT Long Term Goals - 05/09/16 1517    PT LONG TERM GOAL #1   Title understand proper posture and body mechanics   Time 8   Period Weeks   Status New   PT LONG TERM GOAL #2   Title increase lumbar ROM to WNL's   Time 8   Period Weeks   Status New   PT LONG TERM GOAL #3   Title tolerate standing 10 minutes   Time 8   Period Weeks  Status New   PT LONG TERM GOAL #4   Title be able to do her housework without pain > 3/10   Time 8   Period Weeks   Status New   PT LONG TERM GOAL #5   Title decrease pain 50%   Time 8   Period Weeks   Status New               Plan - 05/16/16 1432    Clinical Impression Statement Pt with a small progression to therapeutic exercises. Does reports some pain with bridges but able to push through. Pt with a decrease activity tolerance fatigues with minium exertion. Pt appears worried about being sore later on from doing the exercises.   Rehab Potential Good   PT Frequency 2x / week   PT Duration 8 weeks   PT Treatment/Interventions ADLs/Self Care Home Management;Electrical Stimulation;Moist Heat;Therapeutic exercise;Therapeutic activities;Functional mobility training;Gait training;Ultrasound;Traction;Neuromuscular re-education;Patient/family education;Manual techniques   PT Next Visit Plan slowly add exercises      Patient  will benefit from skilled therapeutic intervention in order to improve the following deficits and impairments:  Decreased activity tolerance, Decreased mobility, Decreased range of motion, Decreased strength, Difficulty walking, Increased muscle spasms, Impaired flexibility, Postural dysfunction, Improper body mechanics, Pain  Visit Diagnosis: Bilateral low back pain with sciatica, sciatica laterality unspecified  Difficulty in walking, not elsewhere classified     Problem List Patient Active Problem List   Diagnosis Date Noted  . HTN (hypertension) 03/06/2012  . Dyslipidemia 03/06/2012  . Upper GIB (gastrointestinal bleeding) 03/06/2012  . Weakness generalized 03/06/2012  . Anemia 03/05/2012  . Peptic ulcer disease 03/05/2012    Scot Jun, PTA  05/16/2016, 2:36 PM  [place], [place], [address] Phone: [phone]   Fax:  [phone]  Name: Brianna Camacho MRN: [id number] Date of Birth: Sep 16, 1951

## 2016-05-23 ENCOUNTER — Ambulatory Visit: Payer: Medicare Other | Admitting: Physical Therapy

## 2016-05-23 ENCOUNTER — Encounter: Payer: Self-pay | Admitting: Physical Therapy

## 2016-05-23 DIAGNOSIS — M5442 Lumbago with sciatica, left side: Principal | ICD-10-CM

## 2016-05-23 DIAGNOSIS — M5441 Lumbago with sciatica, right side: Secondary | ICD-10-CM

## 2016-05-23 DIAGNOSIS — R262 Difficulty in walking, not elsewhere classified: Secondary | ICD-10-CM

## 2016-05-23 NOTE — Therapy (Signed)
[place], [place], [address] Phone: [phone]   Fax:  [phone]  Physical Therapy Treatment  Patient Details  Name: Brianna Camacho MRN: [id number] Date of Birth: Oct 19, 1951 Referring Provider: V.Y. Nancy Fetter  Encounter Date: 05/23/2016      PT End of Session - 05/23/16 1106    Visit Number 3   Date for PT Re-Evaluation 07/08/16   PT Start Time 1019   PT Stop Time 1102   PT Time Calculation (min) 43 min   Activity Tolerance Patient tolerated treatment well   Behavior During Therapy [facility] for tasks assessed/performed      Past Medical History  Diagnosis Date  . Gastritis   . Hypertension   . Hyperlipemia   . Iron deficiency anemia     Past Surgical History  Procedure Laterality Date  . Cesarean section    . Esophagogastroduodenoscopy  03/06/2012    Procedure: ESOPHAGOGASTRODUODENOSCOPY (EGD);  Surgeon: Beryle Beams, MD;  Location: [facility] ENDOSCOPY;  Service: Endoscopy;  Laterality: N/A;  . Hot hemostasis  03/06/2012    Procedure: HOT HEMOSTASIS (ARGON PLASMA COAGULATION/BICAP);  Surgeon: Beryle Beams, MD;  Location: [facility] ENDOSCOPY;  Service: Endoscopy;;    There were no vitals filed for this visit.      Subjective Assessment - 05/23/16 1108    Subjective Pt. reports LBP as a 6/10 pain initially today.  No other pain or complaints reported.     Patient Stated Goals have less pain   Currently in Pain? Yes   Pain Score 6    Pain Location Back   Pain Orientation Lower   Pain Descriptors / Indicators Aching   Pain Type Chronic pain   Pain Onset More than a month ago   Multiple Pain Sites No    Today's Treatment:  Therex: B HS, glute, piriformis, SKTC stretch x 30 sec each Hooklying bridge x 15 rep Hooklying bridge with alternating hip abd/ER with blue TB around knees x 10 reps each side B sidelying clam shell with blue TB around knees x 15 reps Hooklying B hip abd/ER with blue TB  around knees x 15 reps  Hooklying alternating LE marching with abdominal bracing with blue TB around knees x 10 reps each side  LTR with LE on (65 cm) green physioball 5" x 5 each way Standing alternating hip abduction, extension  with green TB around ankles x 10 reps each way Quadruped alternating UE/LE with red p-ball (45cm) x 5 reps Bicycle x 5 reps on each (LE only)           PT Short Term Goals - 05/16/16 1436    PT SHORT TERM GOAL #1   Title independent with initial HEP   Status Achieved           PT Long Term Goals - 05/23/16 1106    PT LONG TERM GOAL #1   Title understand proper posture and body mechanics   Time 8   Period Weeks   Status On-going   PT LONG TERM GOAL #2   Title increase lumbar ROM to WNL's   Time 8   Period Weeks   Status On-going   PT LONG TERM GOAL #3   Title tolerate standing 10 minutes   Time 8   Period Weeks   Status On-going   PT LONG TERM GOAL #4   Title be able to do her housework without pain > 3/10   Time  8   Period Weeks   Status On-going   PT LONG TERM GOAL #5   Title decrease pain 50%   Time 8   Period Weeks   Status On-going               Plan - 05/23/16 1107    Clinical Impression Statement Pt. reports LBP as a 6/10 pain initially today.  No other pain or complaints reported.  Pt. tolerated all lumbopelvic strengthening activity today with focus of today's treatment on flexion biased supine activity; pt. tolerated addition of quadruped LE/UE raises today well however reported increased LBP with POE immediately; no other extension activity attempted.     PT Treatment/Interventions ADLs/Self Care Home Management;Electrical Stimulation;Moist Heat;Therapeutic exercise;Therapeutic activities;Functional mobility training;Gait training;Ultrasound;Traction;Neuromuscular re-education;Patient/family education;Manual techniques   PT Next Visit Plan slowly add exercises; continue with flexion biased approach per pt. tolerance       Patient will benefit from skilled therapeutic intervention in order to improve the following deficits and impairments:  Decreased activity tolerance, Decreased mobility, Decreased range of motion, Decreased strength, Difficulty walking, Increased muscle spasms, Impaired flexibility, Postural dysfunction, Improper body mechanics, Pain  Visit Diagnosis: Bilateral low back pain with sciatica, sciatica laterality unspecified  Difficulty in walking, not elsewhere classified     Problem List Patient Active Problem List   Diagnosis Date Noted  . HTN (hypertension) 03/06/2012  . Dyslipidemia 03/06/2012  . Upper GIB (gastrointestinal bleeding) 03/06/2012  . Weakness generalized 03/06/2012  . Anemia 03/05/2012  . Peptic ulcer disease 03/05/2012    Bess Harvest, PTA 05/23/2016, 11:15 AM  [place] [address] [place], [place], [address] Phone: [phone]   Fax:  [phone]  Name: Brianna Camacho MRN: [id number] Date of Birth: [birth year]

## 2016-05-30 ENCOUNTER — Ambulatory Visit: Payer: Medicare Other | Admitting: Physical Therapy

## 2016-05-30 DIAGNOSIS — M5441 Lumbago with sciatica, right side: Secondary | ICD-10-CM

## 2016-05-30 DIAGNOSIS — M5442 Lumbago with sciatica, left side: Principal | ICD-10-CM

## 2016-05-30 DIAGNOSIS — R262 Difficulty in walking, not elsewhere classified: Secondary | ICD-10-CM

## 2016-05-30 NOTE — Therapy (Signed)
[place], [place], [address] Phone: [phone]   Fax:  [phone]  Physical Therapy Treatment  Patient Details  Name: Brianna Camacho MRN: [id number] Date of Birth: [birth year] Referring Provider: V.Y. Nancy Fetter  Encounter Date: 05/30/2016      PT End of Session - 05/30/16 0941    Visit Number 4   Date for PT Re-Evaluation 07/08/16   PT Start Time 0933   PT Stop Time 1015   PT Time Calculation (min) 42 min   Activity Tolerance Patient tolerated treatment well   Behavior During Therapy [facility] for tasks assessed/performed      Past Medical History  Diagnosis Date  . Gastritis   . Hypertension   . Hyperlipemia   . Iron deficiency anemia     Past Surgical History  Procedure Laterality Date  . Cesarean section    . Esophagogastroduodenoscopy  03/06/2012    Procedure: ESOPHAGOGASTRODUODENOSCOPY (EGD);  Surgeon: Beryle Beams, MD;  Location: [facility] ENDOSCOPY;  Service: Endoscopy;  Laterality: N/A;  . Hot hemostasis  03/06/2012    Procedure: HOT HEMOSTASIS (ARGON PLASMA COAGULATION/BICAP);  Surgeon: Beryle Beams, MD;  Location: [facility] ENDOSCOPY;  Service: Endoscopy;;    There were no vitals filed for this visit.      Subjective Assessment - 05/30/16 0939    Subjective Pt. reports LBP as a 4/10 pain initially today.  No other pain or complaints reported today.  Pt. reports she has been able to perform the HEP activities each day with no problems.     Patient Stated Goals have less pain   Currently in Pain? Yes   Pain Score 4    Pain Location Back   Pain Orientation Lower   Pain Descriptors / Indicators Aching   Pain Type Chronic pain   Pain Onset More than a month ago   Multiple Pain Sites No        Today's Treatment:  Therex: NuStep: level 4, 4 min  Manual:  B HS, glute, piriformis, SKTC stretch x 30 sec each  Therex: Hooklying bridge x 15 rep Hooklying bridge with alternating hip  abd/ER with blue TB around knees x 10 reps each side B sidelying clam shell with blue TB around knees x 15 reps Hooklying B hip abd/ER with blue TB around knees x 15 reps   Hooklying alternating LE marching with abdominal bracing with blue TB around knees x 10 reps each side  LTR with LE on (65 cm) green physioball 5" x 5 each way Standing alternating hip abduction with green TB around ankles x 10 reps each way Quadruped alternating UE/LE with red p-ball (45cm) x 5 reps  * Pt. Reports 2/10 LBP following therex        PT Short Term Goals - 05/16/16 1436    PT SHORT TERM GOAL #1   Title independent with initial HEP   Status Achieved           PT Long Term Goals - 05/23/16 1106    PT LONG TERM GOAL #1   Title understand proper posture and body mechanics   Time 8   Period Weeks   Status On-going   PT LONG TERM GOAL #2   Title increase lumbar ROM to WNL's   Time 8   Period Weeks   Status On-going   PT LONG TERM GOAL #3   Title tolerate standing 10 minutes   Time 8   Period  Weeks   Status On-going   PT LONG TERM GOAL #4   Title be able to do her housework without pain > 3/10   Time 8   Period Weeks   Status On-going   PT LONG TERM GOAL #5   Title decrease pain 50%   Time 8   Period Weeks   Status On-going               Plan - 05/30/16 1000    Clinical Impression Statement Pt. reports LBP as a 4/10 pain initially today.  No other pain or complaints reported today.  Pt. reports she has been able to perform the HEP activities each day with no problems.  Pt. tolerated all lumbopelvic strengthening and stretching with no additional LBP reported; pt. reported a decrease in LBP following supine and standing therex.  Pt. very compliant and cooperative with PT and advancing toward goals well.     PT Treatment/Interventions ADLs/Self Care Home Management;Electrical Stimulation;Moist Heat;Therapeutic exercise;Therapeutic activities;Functional mobility training;Gait  training;Ultrasound;Traction;Neuromuscular re-education;Patient/family education;Manual techniques   PT Next Visit Plan slowly add exercises; continue with flexion biased approach per pt. tolerance      Patient will benefit from skilled therapeutic intervention in order to improve the following deficits and impairments:  Decreased activity tolerance, Decreased mobility, Decreased range of motion, Decreased strength, Difficulty walking, Increased muscle spasms, Impaired flexibility, Postural dysfunction, Improper body mechanics, Pain  Visit Diagnosis: Bilateral low back pain with sciatica, sciatica laterality unspecified  Difficulty in walking, not elsewhere classified     Problem List Patient Active Problem List   Diagnosis Date Noted  . HTN (hypertension) 03/06/2012  . Dyslipidemia 03/06/2012  . Upper GIB (gastrointestinal bleeding) 03/06/2012  . Weakness generalized 03/06/2012  . Anemia 03/05/2012  . Peptic ulcer disease 03/05/2012    Bess Harvest, PTA 05/30/2016, 10:34 AM  [place] [address] [place], [place], [address] Phone: [phone]   Fax:  [phone]  Name: Brianna Camacho MRN: [id number] Date of Birth: 03-11-1951

## 2016-06-04 ENCOUNTER — Encounter: Payer: Self-pay | Admitting: Physical Therapy

## 2016-06-04 ENCOUNTER — Ambulatory Visit: Payer: Medicare Other | Admitting: Physical Therapy

## 2016-06-04 DIAGNOSIS — R262 Difficulty in walking, not elsewhere classified: Secondary | ICD-10-CM

## 2016-06-04 DIAGNOSIS — M5442 Lumbago with sciatica, left side: Principal | ICD-10-CM

## 2016-06-04 DIAGNOSIS — M5441 Lumbago with sciatica, right side: Secondary | ICD-10-CM

## 2016-06-04 NOTE — Therapy (Signed)
[place], [place], [address] Phone: [phone]   Fax:  [phone]  Physical Therapy Treatment  Patient Details  Name: Brianna Camacho MRN: [id number] Date of Birth: Apr 29, 1951 Referring Provider: V.Y. Nancy Fetter  Encounter Date: 06/04/2016      PT End of Session - 06/04/16 1653    Visit Number 5   Date for PT Re-Evaluation 07/08/16   PT Start Time 1601   PT Stop Time 1702   PT Time Calculation (min) 61 min   Activity Tolerance Patient tolerated treatment well   Behavior During Therapy [facility] for tasks assessed/performed      Past Medical History  Diagnosis Date  . Gastritis   . Hypertension   . Hyperlipemia   . Iron deficiency anemia     Past Surgical History  Procedure Laterality Date  . Cesarean section    . Esophagogastroduodenoscopy  03/06/2012    Procedure: ESOPHAGOGASTRODUODENOSCOPY (EGD);  Surgeon: Beryle Beams, MD;  Location: [facility] ENDOSCOPY;  Service: Endoscopy;  Laterality: N/A;  . Hot hemostasis  03/06/2012    Procedure: HOT HEMOSTASIS (ARGON PLASMA COAGULATION/BICAP);  Surgeon: Beryle Beams, MD;  Location: [facility] ENDOSCOPY;  Service: Endoscopy;;    There were no vitals filed for this visit.      Subjective Assessment - 06/04/16 1603    Subjective Pt reports that for the past two days she has felt fine,  but today her pain started back, she thinks its from her doing a lot of standing.   Currently in Pain? Yes   Pain Score 5                          [facility] Adult PT Treatment/Exercise - 06/04/16 0001    Lumbar Exercises: Stretches   Passive Hamstring Stretch 5 reps;10 seconds   Single Knee to Chest Stretch 2 reps;10 seconds   Double Knee to Chest Stretch 3 reps;10 seconds   Piriformis Stretch 4 reps;10 seconds   Lumbar Exercises: Aerobic   Stationary Bike NuStep L4 x6 minutes   Lumbar Exercises: Machines for Strengthening   Other Lumbar Machine Exercise Rows #10  2x10; Lats #15 2x10    Lumbar Exercises: Supine   Other Supine Lumbar Exercises Seated trunk rotations & OHP with yellow ball 2x10; Seated on sit fit LAQ and marching 3lb 2x10   Modalities   Modalities Electrical Stimulation;Moist Heat   Moist Heat Therapy   Number Minutes Moist Heat 15 Minutes   Moist Heat Location Lumbar Spine   Electrical Stimulation   Electrical Stimulation Location Lumbar spine    Electrical Stimulation Action IFC   Electrical Stimulation Parameters supine   Electrical Stimulation Goals Pain                  PT Short Term Goals - 05/16/16 1436    PT SHORT TERM GOAL #1   Title independent with initial HEP   Status Achieved           PT Long Term Goals - 05/23/16 1106    PT LONG TERM GOAL #1   Title understand proper posture and body mechanics   Time 8   Period Weeks   Status On-going   PT LONG TERM GOAL #2   Title increase lumbar ROM to WNL's   Time 8   Period Weeks   Status On-going   PT LONG TERM GOAL #3   Title tolerate standing  10 minutes   Time 8   Period Weeks   Status On-going   PT LONG TERM GOAL #4   Title be able to do her housework without pain > 3/10   Time 8   Period Weeks   Status On-going   PT LONG TERM GOAL #5   Title decrease pain 50%   Time 8   Period Weeks   Status On-going               Plan - 06/04/16 1654    Clinical Impression Statement Pt tolerated addition core interventions without c/o pain. Pt reports that she is improving and her pain today was due to excessive standing. Pt does report decrease pain and she completed today's interventions.   Rehab Potential Good   PT Frequency 2x / week   PT Duration 8 weeks   PT Treatment/Interventions ADLs/Self Care Home Management;Electrical Stimulation;Moist Heat;Therapeutic exercise;Therapeutic activities;Functional mobility training;Gait training;Ultrasound;Traction;Neuromuscular re-education;Patient/family education;Manual techniques   PT Next Visit  Plan slowly add exercises; continue with flexion biased approach per pt. tolerance      Patient will benefit from skilled therapeutic intervention in order to improve the following deficits and impairments:  Decreased activity tolerance, Decreased mobility, Decreased range of motion, Decreased strength, Difficulty walking, Increased muscle spasms, Impaired flexibility, Postural dysfunction, Improper body mechanics, Pain  Visit Diagnosis: Bilateral low back pain with sciatica, sciatica laterality unspecified  Difficulty in walking, not elsewhere classified     Problem List Patient Active Problem List   Diagnosis Date Noted  . HTN (hypertension) 03/06/2012  . Dyslipidemia 03/06/2012  . Upper GIB (gastrointestinal bleeding) 03/06/2012  . Weakness generalized 03/06/2012  . Anemia 03/05/2012  . Peptic ulcer disease 03/05/2012    Scot Jun, PTA  06/04/2016, 4:56 PM  [place] [address] [place], [place], [address] Phone: [phone]   Fax:  [phone]  Name: Mitra Nasrallah MRN: [id number] Date of Birth: [birth year]

## 2016-06-07 ENCOUNTER — Ambulatory Visit: Payer: Medicare Other | Admitting: Physical Therapy

## 2016-06-11 ENCOUNTER — Ambulatory Visit: Payer: Medicare Other | Attending: Family Medicine | Admitting: Physical Therapy

## 2016-06-11 ENCOUNTER — Encounter: Payer: Self-pay | Admitting: Physical Therapy

## 2016-06-11 DIAGNOSIS — R262 Difficulty in walking, not elsewhere classified: Secondary | ICD-10-CM | POA: Diagnosis present

## 2016-06-11 DIAGNOSIS — M5442 Lumbago with sciatica, left side: Secondary | ICD-10-CM | POA: Insufficient documentation

## 2016-06-11 DIAGNOSIS — M5441 Lumbago with sciatica, right side: Secondary | ICD-10-CM | POA: Diagnosis not present

## 2016-06-11 NOTE — Therapy (Signed)
[place], [place], [address] Phone: [phone]   Fax:  [phone]  Physical Therapy Treatment  Patient Details  Name: Brianna Camacho MRN: [id number] Date of Birth: 18-Sep-1951 Referring Provider: V.Y. Nancy Fetter  Encounter Date: 06/11/2016      PT End of Session - 06/11/16 1012    Visit Number 6   Date for PT Re-Evaluation 07/08/16   PT Start Time 0930   PT Stop Time 1027   PT Time Calculation (min) 57 min   Activity Tolerance Patient tolerated treatment well   Behavior During Therapy [facility] for tasks assessed/performed      Past Medical History  Diagnosis Date  . Gastritis   . Hypertension   . Hyperlipemia   . Iron deficiency anemia     Past Surgical History  Procedure Laterality Date  . Cesarean section    . Esophagogastroduodenoscopy  03/06/2012    Procedure: ESOPHAGOGASTRODUODENOSCOPY (EGD);  Surgeon: Beryle Beams, MD;  Location: [facility] ENDOSCOPY;  Service: Endoscopy;  Laterality: N/A;  . Hot hemostasis  03/06/2012    Procedure: HOT HEMOSTASIS (ARGON PLASMA COAGULATION/BICAP);  Surgeon: Beryle Beams, MD;  Location: [facility] ENDOSCOPY;  Service: Endoscopy;;    There were no vitals filed for this visit.      Subjective Assessment - 06/11/16 0933    Subjective Pt reports that she still has a little pain but her pain has gotten much better since starting therapy   Currently in Pain? Yes   Pain Score 4             [facility] PT Assessment - 06/11/16 0001    AROM   Overall AROM Comments lumbar ROM WFL but with little pain   Strength   Overall Strength Comments 4/5 No pin                     [facility] Adult PT Treatment/Exercise - 06/11/16 0001    Lumbar Exercises: Stretches   Passive Hamstring Stretch 5 reps;10 seconds   Single Knee to Chest Stretch 2 reps;10 seconds   Double Knee to Chest Stretch 3 reps;10 seconds   Piriformis Stretch 4 reps;10 seconds   Lumbar Exercises: Aerobic    Stationary Bike NuStep L4 x6 minutes   Lumbar Exercises: Machines for Strengthening   Cybex Knee Extension 5lb 2x10   Cybex Knee Flexion 20lb 2x10   Leg Press 20lb 2x10    Other Lumbar Machine Exercise Rows #20 2x10; Lats #20 2x10    Lumbar Exercises: Supine   Other Supine Lumbar Exercises Bridge with clam shell x10   Modalities   Modalities Electrical Stimulation;Moist Heat   Moist Heat Therapy   Number Minutes Moist Heat 15 Minutes   Moist Heat Location Lumbar Spine   Electrical Stimulation   Electrical Stimulation Location Lumbar spine    Electrical Stimulation Action IF   Electrical Stimulation Parameters IFC   Electrical Stimulation Goals Pain                  PT Short Term Goals - 05/16/16 1436    PT SHORT TERM GOAL #1   Title independent with initial HEP   Status Achieved           PT Long Term Goals - 06/11/16 1013    PT LONG TERM GOAL #2   Title increase lumbar ROM to WNL's   Status Achieved   PT LONG TERM GOAL #5   Title  decrease pain 50%   Status Partially Met               Plan - 06/11/16 1012    Clinical Impression Statement Pt has progressed and increased her LE strength and lumbar ROM. Pt reports little pain when assessing lumbar ROM. Tolerated all machine level interventions well.    PT Frequency 2x / week   PT Treatment/Interventions ADLs/Self Care Home Management;Electrical Stimulation;Moist Heat;Therapeutic exercise;Therapeutic activities;Functional mobility training;Gait training;Ultrasound;Traction;Neuromuscular re-education;Patient/family education;Manual techniques   PT Next Visit Plan slowly add exercises; continue with flexion biased approach per pt. tolerance      Patient will benefit from skilled therapeutic intervention in order to improve the following deficits and impairments:  Decreased activity tolerance, Decreased mobility, Decreased range of motion, Decreased strength, Difficulty walking, Increased muscle spasms,  Impaired flexibility, Postural dysfunction, Improper body mechanics, Pain  Visit Diagnosis: Bilateral low back pain with sciatica, sciatica laterality unspecified  Difficulty in walking, not elsewhere classified     Problem List Patient Active Problem List   Diagnosis Date Noted  . HTN (hypertension) 03/06/2012  . Dyslipidemia 03/06/2012  . Upper GIB (gastrointestinal bleeding) 03/06/2012  . Weakness generalized 03/06/2012  . Anemia 03/05/2012  . Peptic ulcer disease 03/05/2012    Scot Jun, PTA  06/11/2016, 10:15 AM  [place], [place], [address] Phone: [phone]   Fax:  [phone]  Name: Brianna Camacho MRN: [id number] Date of Birth: 06/24/1951

## 2016-06-14 ENCOUNTER — Encounter: Payer: Self-pay | Admitting: Physical Therapy

## 2016-06-14 ENCOUNTER — Ambulatory Visit: Payer: Medicare Other | Admitting: Physical Therapy

## 2016-06-14 DIAGNOSIS — R262 Difficulty in walking, not elsewhere classified: Secondary | ICD-10-CM

## 2016-06-14 DIAGNOSIS — M5442 Lumbago with sciatica, left side: Principal | ICD-10-CM

## 2016-06-14 DIAGNOSIS — M5441 Lumbago with sciatica, right side: Secondary | ICD-10-CM | POA: Diagnosis not present

## 2016-06-14 NOTE — Therapy (Signed)
[place], [place], [address] Phone: [phone]   Fax:  [phone]  Physical Therapy Treatment  Patient Details  Name: Brianna Camacho MRN: [id number] Date of Birth: [birth year] Referring Provider: V.Y. Nancy Fetter  Encounter Date: 06/14/2016      PT End of Session - 06/14/16 1013    Visit Number 7   Date for PT Re-Evaluation 07/08/16   PT Start Time 0930   PT Stop Time 1027   PT Time Calculation (min) 57 min   Activity Tolerance Patient tolerated treatment well   Behavior During Therapy [facility] for tasks assessed/performed      Past Medical History  Diagnosis Date  . Gastritis   . Hypertension   . Hyperlipemia   . Iron deficiency anemia     Past Surgical History  Procedure Laterality Date  . Cesarean section    . Esophagogastroduodenoscopy  03/06/2012    Procedure: ESOPHAGOGASTRODUODENOSCOPY (EGD);  Surgeon: Beryle Beams, MD;  Location: [facility] ENDOSCOPY;  Service: Endoscopy;  Laterality: N/A;  . Hot hemostasis  03/06/2012    Procedure: HOT HEMOSTASIS (ARGON PLASMA COAGULATION/BICAP);  Surgeon: Beryle Beams, MD;  Location: [facility] ENDOSCOPY;  Service: Endoscopy;;    There were no vitals filed for this visit.      Subjective Assessment - 06/14/16 0936    Subjective Pt reports little pain but it continues to get better. Pt reports she did a lot walking yesterday seeing her relatives.   Currently in Pain? Yes   Pain Score 4    Pain Location Back   Pain Orientation Lower                         [facility] Adult PT Treatment/Exercise - 06/14/16 0001    Lumbar Exercises: Aerobic   Stationary Bike NuStep L4 x7 minutes   Lumbar Exercises: Machines for Strengthening   Cybex Knee Extension 5lb 2x15   Cybex Knee Flexion 20lb 2x15   Leg Press 20lb 2x15    Other Lumbar Machine Exercise Rows #20 x15; Lats #20 x15    Lumbar Exercises: Standing   Other Standing Lumbar Exercises Seated OHP 3lb  2x10   Other Standing Lumbar Exercises Hip ext and abd  3lb x10   Lumbar Exercises: Seated   Sit to Stand 10 reps  x2, with OHP with yellow ball   Modalities   Modalities Electrical Stimulation;Moist Heat   Moist Heat Therapy   Number Minutes Moist Heat 15 Minutes   Moist Heat Location Lumbar Spine   Electrical Stimulation   Electrical Stimulation Location Lumbar spine    Electrical Stimulation Action IFC   Electrical Stimulation Parameters pt tolerance   Electrical Stimulation Goals Pain                  PT Short Term Goals - 05/16/16 1436    PT SHORT TERM GOAL #1   Title independent with initial HEP   Status Achieved           PT Long Term Goals - 06/11/16 1013    PT LONG TERM GOAL #2   Title increase lumbar ROM to WNL's   Status Achieved   PT LONG TERM GOAL #5   Title decrease pain 50%   Status Partially Met               Plan - 06/14/16 1013    Clinical Impression Statement Pt tolerated additional interventions  without c/o pain. Pt reports a sight increase in pain after prolong standing.   Rehab Potential Good   PT Frequency 2x / week   PT Duration 8 weeks   PT Treatment/Interventions ADLs/Self Care Home Management;Electrical Stimulation;Moist Heat;Therapeutic exercise;Therapeutic activities;Functional mobility training;Gait training;Ultrasound;Traction;Neuromuscular re-education;Patient/family education;Manual techniques   PT Next Visit Plan slowly add exercises; continue with flexion biased approach per pt. tolerance      Patient will benefit from skilled therapeutic intervention in order to improve the following deficits and impairments:  Decreased activity tolerance, Decreased mobility, Decreased range of motion, Decreased strength, Difficulty walking, Increased muscle spasms, Impaired flexibility, Postural dysfunction, Improper body mechanics, Pain  Visit Diagnosis: Bilateral low back pain with sciatica, sciatica laterality  unspecified  Difficulty in walking, not elsewhere classified     Problem List Patient Active Problem List   Diagnosis Date Noted  . HTN (hypertension) 03/06/2012  . Dyslipidemia 03/06/2012  . Upper GIB (gastrointestinal bleeding) 03/06/2012  . Weakness generalized 03/06/2012  . Anemia 03/05/2012  . Peptic ulcer disease 03/05/2012    Scot Jun, PTA 06/14/2016, 10:15 AM  [place], [place], [address] Phone: [phone]   Fax:  [phone]  Name: Brianna Camacho MRN: [id number] Date of Birth: Nov 10, 1951

## 2016-06-18 ENCOUNTER — Encounter: Payer: Self-pay | Admitting: Physical Therapy

## 2016-06-18 ENCOUNTER — Ambulatory Visit: Payer: Medicare Other | Admitting: Physical Therapy

## 2016-06-18 DIAGNOSIS — R262 Difficulty in walking, not elsewhere classified: Secondary | ICD-10-CM

## 2016-06-18 DIAGNOSIS — M5441 Lumbago with sciatica, right side: Secondary | ICD-10-CM | POA: Diagnosis not present

## 2016-06-18 DIAGNOSIS — M5442 Lumbago with sciatica, left side: Principal | ICD-10-CM

## 2016-06-18 NOTE — Therapy (Signed)
[place], [place], [address] Phone: [phone]   Fax:  [phone]  Physical Therapy Treatment  Patient Details  Name: Brianna Camacho MRN: [id number] Date of Birth: 1950-12-22 Referring Provider: V.Y. Nancy Fetter  Encounter Date: 06/18/2016      PT End of Session - 06/18/16 0953    Visit Number 8   Date for PT Re-Evaluation 07/08/16   PT Start Time 0925   PT Stop Time 1020   PT Time Calculation (min) 55 min      Past Medical History  Diagnosis Date  . Gastritis   . Hypertension   . Hyperlipemia   . Iron deficiency anemia     Past Surgical History  Procedure Laterality Date  . Cesarean section    . Esophagogastroduodenoscopy  03/06/2012    Procedure: ESOPHAGOGASTRODUODENOSCOPY (EGD);  Surgeon: Beryle Beams, MD;  Location: [facility] ENDOSCOPY;  Service: Endoscopy;  Laterality: N/A;  . Hot hemostasis  03/06/2012    Procedure: HOT HEMOSTASIS (ARGON PLASMA COAGULATION/BICAP);  Surgeon: Beryle Beams, MD;  Location: [facility] ENDOSCOPY;  Service: Endoscopy;;    There were no vitals filed for this visit.      Subjective Assessment - 06/18/16 0926    Subjective some increased pain today   Patient is accompained by: Interpreter   Currently in Pain? Yes   Pain Score 6    Pain Location Back                         [facility] Adult PT Treatment/Exercise - 06/18/16 0001    Lumbar Exercises: Aerobic   Stationary Bike NuStep L4 x7 minutes   Lumbar Exercises: Machines for Strengthening   Cybex Knee Extension 5lb 2x15   Cybex Knee Flexion 20lb 2x15   Leg Press 20lb 2x15    Other Lumbar Machine Exercise Rows #20 x15; Lats #20 x15   2 sets   Lumbar Exercises: Standing   Other Standing Lumbar Exercises sit to stand OH press 10 times, chest press 10 times   Other Standing Lumbar Exercises pulley trunk rotation 10 # 15 times each  red tband hip ext and abd 10 times each   Moist Heat Therapy   Number Minutes Moist Heat 15 Minutes   Moist Heat Location Lumbar Spine   Electrical Stimulation   Electrical Stimulation Location Lumbar spine    Electrical Stimulation Action IFC   Electrical Stimulation Goals Pain                PT Education - 06/18/16 0950    Education provided Yes   Education Details BM lifting   Person(s) Educated Patient   Methods Explanation;Demonstration;Verbal cues;Tactile cues   Comprehension Verbal cues required;Tactile cues required;Need further instruction          PT Short Term Goals - 05/16/16 1436    PT SHORT TERM GOAL #1   Title independent with initial HEP   Status Achieved           PT Long Term Goals - 06/18/16 0947    PT LONG TERM GOAL #1   Title understand proper posture and body mechanics   Status On-going   PT LONG TERM GOAL #2   Title increase lumbar ROM to WNL's   Baseline Lumbar WNLs   Status Achieved   PT LONG TERM GOAL #3   Title tolerate standing 10 minutes   Baseline 15 min   Status Achieved  PT LONG TERM GOAL #4   Title be able to do her housework without pain > 3/10   Status On-going   PT LONG TERM GOAL #5   Title decrease pain 50%   Status Partially Met               Plan - 06/18/16 0953    Clinical Impression Statement VCing and tactile needed with BM and new ther ex. progressing with LTGs. Pt needed further BM training and education.   PT Next Visit Plan BM training and education.      Patient will benefit from skilled therapeutic intervention in order to improve the following deficits and impairments:  Decreased activity tolerance, Decreased mobility, Decreased range of motion, Decreased strength, Difficulty walking, Increased muscle spasms, Impaired flexibility, Postural dysfunction, Improper body mechanics, Pain  Visit Diagnosis: Bilateral low back pain with sciatica, sciatica laterality unspecified  Difficulty in walking, not elsewhere classified     Problem List Patient  Active Problem List   Diagnosis Date Noted  . HTN (hypertension) 03/06/2012  . Dyslipidemia 03/06/2012  . Upper GIB (gastrointestinal bleeding) 03/06/2012  . Weakness generalized 03/06/2012  . Anemia 03/05/2012  . Peptic ulcer disease 03/05/2012    Brianna Camacho,ANGIE PTA 06/18/2016, 9:55 AM  [place] [address] [place], [place], [address] Phone: [phone]   Fax:  [phone]  Name: Brianna Camacho MRN: [id number] Date of Birth: 1951/04/30

## 2016-06-21 ENCOUNTER — Ambulatory Visit: Payer: Medicare Other | Admitting: Physical Therapy

## 2016-06-28 ENCOUNTER — Encounter: Payer: Self-pay | Admitting: Physical Therapy

## 2016-06-28 ENCOUNTER — Ambulatory Visit: Payer: Medicare Other | Admitting: Physical Therapy

## 2016-06-28 DIAGNOSIS — M5441 Lumbago with sciatica, right side: Secondary | ICD-10-CM | POA: Diagnosis not present

## 2016-06-28 DIAGNOSIS — R262 Difficulty in walking, not elsewhere classified: Secondary | ICD-10-CM

## 2016-06-28 DIAGNOSIS — M5442 Lumbago with sciatica, left side: Principal | ICD-10-CM

## 2016-06-28 NOTE — Therapy (Addendum)
[place], [place], [address] Phone: [phone]   Fax:  [phone]  Physical Therapy Treatment  Patient Details  Name: Brianna Camacho MRM: [id number] Date of Birth: 1951-09-09 Referring Provider: V.Y. Nancy Fetter  Encounter Date: 06/28/2016      PT End of Session - 06/28/16 1007    Visit Number 9   Date for PT Re-Evaluation 07/08/16   PT Start Time 0929   PT Stop Time 1024   PT Time Calculation (min) 55 min   Activity Tolerance Patient tolerated treatment well   Behavior During Therapy [facility] for tasks assessed/performed      Past Medical History  Diagnosis Date  . Gastritis   . Hypertension   . Hyperlipemia   . Iron deficiency anemia     Past Surgical History  Procedure Laterality Date  . Cesarean section    . Esophagogastroduodenoscopy  03/06/2012    Procedure: ESOPHAGOGASTRODUODENOSCOPY (EGD);  Surgeon: Beryle Beams, MD;  Location: [facility] ENDOSCOPY;  Service: Endoscopy;  Laterality: N/A;  . Hot hemostasis  03/06/2012    Procedure: HOT HEMOSTASIS (ARGON PLASMA COAGULATION/BICAP);  Surgeon: Beryle Beams, MD;  Location: [facility] ENDOSCOPY;  Service: Endoscopy;;    There were no vitals filed for this visit.      Subjective Assessment - 06/28/16 0931    Subjective Pt reports that things are going good and she has no pain. Pt also reports that she is able to stand and cook without pain   Patient is accompained by: Interpreter   Currently in Pain? No/denies   Pain Score 0-No pain                         [facility] Adult PT Treatment/Exercise - 06/28/16 0001    Lumbar Exercises: Aerobic   Stationary Bike L0 x6 minutes   Lumbar Exercises: Machines for Strengthening   Cybex Knee Extension 10lb 2x10   Cybex Knee Flexion 25lb 2x10   Leg Press 30lb 3x10    Other Lumbar Machine Exercise Rows #20 2x15; Lats #20 2x15    Lumbar Exercises: Standing   Other Standing Lumbar Exercises Functional  box carry 5lb 107f x4   Given proper education on proper lifting form and technique     Other Standing Lumbar Exercises Overhead ext with yellow ball 2x10                  PT Short Term Goals - 05/16/16 1436    PT SHORT TERM GOAL #1   Title independent with initial HEP   Status Achieved           PT Long Term Goals - 06/28/16 0935    PT LONG TERM GOAL #1   Title understand proper posture and body mechanics   Status Achieved   PT LONG TERM GOAL #2   Title increase lumbar ROM to WNL's   Status Achieved   PT LONG TERM GOAL #3   Title tolerate standing 10 minutes   Status Achieved   PT LONG TERM GOAL #4   Title be able to do her housework without pain > 3/10   Status Achieved   PT LONG TERM GOAL #5   Title decrease pain 50%   Status Achieved               Plan - 06/28/16 1008    Clinical Impression Statement Pt has progressed and completed all goals. Pt  reports that she has ben walking in the mornings for ~ 20 minutes. Able to complete functional box carry with proper form. No reports of pain during today's treatment.   Rehab Potential Good   PT Frequency 2x / week   PT Duration 8 weeks   PT Treatment/Interventions ADLs/Self Care Home Management;Electrical Stimulation;Moist Heat;Therapeutic exercise;Therapeutic activities;Functional mobility training;Gait training;Ultrasound;Traction;Neuromuscular re-education;Patient/family education;Manual techniques   PT Next Visit Plan D/C PT       Patient will benefit from skilled therapeutic intervention in order to improve the following deficits and impairments:  Decreased activity tolerance, Decreased mobility, Decreased range of motion, Decreased strength, Difficulty walking, Increased muscle spasms, Impaired flexibility, Postural dysfunction, Improper body mechanics, Pain  Visit Diagnosis: Bilateral low back pain with sciatica, sciatica laterality unspecified  Difficulty in walking, not elsewhere  classified     Problem List Patient Active Problem List   Diagnosis Date Noted  . HTN (hypertension) 03/06/2012  . Dyslipidemia 03/06/2012  . Upper GIB (gastrointestinal bleeding) 03/06/2012  . Weakness generalized 03/06/2012  . Anemia 03/05/2012  . Peptic ulcer disease 03/05/2012    PHYSICAL THERAPY DISCHARGE SUMMARY  Visits from Start of Care: 9   Plan: Patient agrees to discharge.  Patient goals were partially met. Patient is being discharged due to meeting the stated rehab goals.  ?????       Scot Jun, PTA  06/28/2016, 10:14 AM  [facility] [place], [place], [address] Phone: [phone]   Fax:  [phone]  Name: Brianna Camacho MRN: [id number] Date of Birth: 1951/08/21

## 2016-07-11 ENCOUNTER — Other Ambulatory Visit: Payer: Self-pay | Admitting: Family Medicine

## 2016-07-11 DIAGNOSIS — Z719 Counseling, unspecified: Secondary | ICD-10-CM

## 2016-07-11 DIAGNOSIS — Z1231 Encounter for screening mammogram for malignant neoplasm of breast: Secondary | ICD-10-CM

## 2016-07-11 NOTE — Congregational Nurse Program (Signed)
Congregational Nurse Program Note  Date of Encounter: 07/11/2016  Past Medical History: Past Medical History:  Diagnosis Date  . Gastritis   . Hyperlipemia   . Hypertension   . Iron deficiency anemia     Encounter Details:     CNP Questionnaire - 07/11/16 1645      Patient Demographics   Is this a new or existing patient? Existing   Patient is considered a/an Refugee   Race Asian     Patient Assistance   Location of Patient Assistance Not Applicable   Patient's financial/insurance status Low Income;Medicaid;Medicare   Uninsured Patient No   Patient referred to apply for the following financial assistance Not Applicable   Food insecurities addressed Not Applicable   Transportation assistance No   Assistance securing medications No   Educational health offerings Navigating the healthcare system     Encounter Details   Primary purpose of visit [place]   Was an Emergency Department visit averted? Not Applicable   Does patient have a medical provider? Yes   Patient referred to Not Applicable   Was a mental health screening completed? (GAINS tool) No   Does patient have dental issues? No   Does patient have vision issues? No   Does your patient have an abnormal blood pressure today? No   Since previous encounter, have you referred patient for abnormal blood pressure that resulted in a new diagnosis or medication change? No   Does your patient have an abnormal blood glucose today? No   Since previous encounter, have you referred patient for abnormal blood glucose that resulted in a new diagnosis or medication change? No   Was there a life-saving intervention made? No      Patient came to CN office for help in scheduling a mammogram appointment. CN called [facility] and made her an appointment for 08/21/2016 @10 :30 am.  Jake Michaelis RN, Congregational Nurse [phone].

## 2016-08-20 ENCOUNTER — Ambulatory Visit: Payer: Medicare Other

## 2016-08-21 ENCOUNTER — Ambulatory Visit
Admission: RE | Admit: 2016-08-21 | Discharge: 2016-08-21 | Disposition: A | Discharge status: Home / Self Care | Payer: Medicare Other | Source: Ambulatory Visit | Attending: Family Medicine | Admitting: Family Medicine

## 2016-08-21 DIAGNOSIS — Z1231 Encounter for screening mammogram for malignant neoplasm of breast: Secondary | ICD-10-CM

## 2016-08-23 ENCOUNTER — Other Ambulatory Visit: Payer: Self-pay | Admitting: Family Medicine

## 2016-08-23 DIAGNOSIS — R928 Other abnormal and inconclusive findings on diagnostic imaging of breast: Secondary | ICD-10-CM

## 2016-08-29 DIAGNOSIS — Z719 Counseling, unspecified: Secondary | ICD-10-CM

## 2016-08-29 NOTE — Congregational Nurse Program (Signed)
Congregational Nurse Program Note  Date of Encounter: 08/29/2016  Past Medical History: Past Medical History:  Diagnosis Date  . Gastritis   . Hyperlipemia   . Hypertension   . Iron deficiency anemia     Encounter Details:Patient came to CN office for help interpreting lab results from her MD. Interpreter [name] reviewed results which had been mailed to patient from Dr. Pershing Cox at [facility] [place].  Jake Michaelis RN, Congregational Nurse [phone]     CNP Questionnaire - 08/29/16 1818      Patient Demographics   Is this a new or existing patient? Existing   Patient is considered a/an Refugee   Race Asian     Patient Assistance   Location of Patient Assistance Not Applicable   Patient's financial/insurance status Low Income;Medicaid;Medicare   Uninsured Patient No   Patient referred to apply for the following financial assistance Not Applicable   Food insecurities addressed Not Applicable   Transportation assistance No   Assistance securing medications No   Educational health offerings Health literacy     Encounter Details   Primary purpose of visit Navigating the Healthcare System;Education/Health Concerns   Was an Emergency Department visit averted? Not Applicable   Does patient have a medical provider? Yes   Patient referred to Not Applicable   Was a mental health screening completed? (GAINS tool) No   Does patient have dental issues? No   Does patient have vision issues? No   Does your patient have an abnormal blood pressure today? No   Since previous encounter, have you referred patient for abnormal blood pressure that resulted in a new diagnosis or medication change? No   Does your patient have an abnormal blood glucose today? No   Since previous encounter, have you referred patient for abnormal blood glucose that resulted in a new diagnosis or medication change? No   Was there a life-saving intervention made? No

## 2016-09-12 DIAGNOSIS — Z719 Counseling, unspecified: Secondary | ICD-10-CM

## 2016-09-12 NOTE — Congregational Nurse Program (Signed)
Congregational Nurse Program Note  Date of Encounter: 09/12/2016  Past Medical History: Past Medical History:  Diagnosis Date  . Gastritis   . Hyperlipemia   . Hypertension   . Iron deficiency anemia     Encounter Details: Patient came to CN office.  She had a letter from [place] stating she needed to make a follow-up appointment to further evaluate mammogram findings from 08/2016.  CN scheduled appointment for 09/20/2016 @ 11:00 am.  [employer] intern  Helped her complete her food stamp renewal paperwork. Jake Michaelis RN,, Congregational Nurse [phone]     CNP Questionnaire - 09/12/16 2142      Patient Demographics   Is this a new or existing patient? Existing   Patient is considered a/an Refugee   Race Asian     Patient Assistance   Location of Patient Assistance Not Applicable   Patient's financial/insurance status Low Income;Medicaid;Medicare   Uninsured Patient No   Patient referred to apply for the following financial assistance Not Applicable   Food insecurities addressed Not Applicable   Transportation assistance No   Assistance securing medications No   Educational health offerings Navigating the healthcare system     Encounter Details   Primary purpose of visit [place]   Was an Emergency Department visit averted? Not Applicable   Does patient have a medical provider? Yes   Patient referred to Not Applicable   Was a mental health screening completed? (GAINS tool) No   Does patient have dental issues? No   Does patient have vision issues? No   Does your patient have an abnormal blood pressure today? No   Since previous encounter, have you referred patient for abnormal blood pressure that resulted in a new diagnosis or medication change? No   Does your patient have an abnormal blood glucose today? No   Since previous encounter, have you referred patient for abnormal blood glucose that resulted in a new diagnosis or medication  change? No   Was there a life-saving intervention made? No

## 2016-09-20 ENCOUNTER — Ambulatory Visit
Admission: RE | Admit: 2016-09-20 | Discharge: 2016-09-20 | Disposition: A | Discharge status: Home / Self Care | Payer: Medicare Other | Source: Ambulatory Visit | Attending: Family Medicine | Admitting: Family Medicine

## 2016-09-20 DIAGNOSIS — R928 Other abnormal and inconclusive findings on diagnostic imaging of breast: Secondary | ICD-10-CM

## 2016-12-19 NOTE — Congregational Nurse Program (Signed)
Congregational Nurse Program Note  Date of Encounter: 12/19/2016  Past Medical History: Past Medical History:  Diagnosis Date  . Gastritis   . Hyperlipemia   . Hypertension   . Iron deficiency anemia     Encounter Details:  Patient came to CN office for help securing medication because she had no refills on her statin drug.  CN called [place] who will contact MD regarding new prescription.  BP 160/100.  States she has not taken medication today.  She took medicine at 9:15 am.  BP rechecked at 9:50 and was 120/80.States she has MD follow-up scheduled 02/2017.  Jake Michaelis RN, Congregational Nurse [phone]      CNP Questionnaire - 12/19/16 1637      Patient Demographics   Is this a new or existing patient? Existing   Patient is considered a/an Refugee   Race Asian     Patient Assistance   Location of Patient Assistance Not Applicable   Patient's financial/insurance status Low Income;Medicaid;Medicare   Uninsured Patient ([place] Card/Care Connects) No   Patient referred to apply for the following financial assistance Not Applicable   Food insecurities addressed Not Applicable   Transportation assistance No   Assistance securing medications No   Doctor, hospital the healthcare system;Medications     Encounter Details   Primary purpose of visit Navigating the Healthcare System   Was an Emergency Department visit averted? Not Applicable   Does patient have a medical provider? Yes   Patient referred to Not Applicable   Was a mental health screening completed? (GAINS tool) No   Does patient have dental issues? No   Does patient have vision issues? No   Does your patient have an abnormal blood pressure today? No   Since previous encounter, have you referred patient for abnormal blood pressure that resulted in a new diagnosis or medication change? No   Does your patient have an abnormal blood glucose today? No   Since previous encounter, have  you referred patient for abnormal blood glucose that resulted in a new diagnosis or medication change? No   Was there a life-saving intervention made? No

## 2017-01-16 NOTE — Congregational Nurse Program (Signed)
Congregational Nurse Program Note  Date of Encounter: 01/16/2017  Past Medical History: Past Medical History:  Diagnosis Date  . Gastritis   . Hyperlipemia   . Hypertension   . Iron deficiency anemia     Encounter Details:  Patient came to CN office asking for help in scheduling an appointment for a colonoscopy.  She received a letter from [facility] advising her she is due for this procedure.  CN called and made appointment with Dr. Collene Mares on 02/21/2017 at 11:00 am.  Paulina Fusi will accompany her to serve as interpreter.  Jake Michaelis RN, Congregational nurse [phone]     CNP Questionnaire - 01/16/17 2109      Patient Demographics   Is this a new or existing patient? Existing   Patient is considered a/an Refugee   Race Asian     Patient Assistance   Location of Patient Assistance Not Applicable   Patient's financial/insurance status Low Income;Medicaid;Medicare   Uninsured Patient ([facility] Oncologist) No   Patient referred to apply for the following financial assistance Not Applicable   Food insecurities addressed Not Applicable   Transportation assistance No   Assistance securing medications No   Educational Programmer, multimedia the healthcare system     Encounter Details   Primary purpose of visit [place]   Was an Emergency Department visit averted? Not Applicable   Does patient have a medical provider? Yes   Patient referred to Not Applicable   Was a mental health screening completed? (GAINS tool) No   Does patient have dental issues? No   Does patient have vision issues? No   Does your patient have an abnormal blood pressure today? No   Since previous encounter, have you referred patient for abnormal blood pressure that resulted in a new diagnosis or medication change? No   Does your patient have an abnormal blood glucose today? No   Since previous encounter, have you referred patient for abnormal blood glucose that  resulted in a new diagnosis or medication change? No   Was there a life-saving intervention made? No

## 2017-01-30 NOTE — Congregational Nurse Program (Signed)
Congregational Nurse Program Note  Date of Encounter: 01/30/2017  Past Medical History: Past Medical History:  Diagnosis Date  . Gastritis   . Hyperlipemia   . Hypertension   . Iron deficiency anemia     Encounter Details:  Patient came to CN office for help in completing paperwork for her upcoming PCP appointment at [facility] at [place].  She was assisted by interpreter [name] and CN.  Jake Michaelis RN, Congregational Nurse [phone]     CNP Questionnaire - 01/30/17 1650      Patient Demographics   Is this a new or existing patient? Existing   Patient is considered a/an Refugee   Race Asian     Patient Assistance   Location of Patient Assistance Not Applicable   Patient's financial/insurance status Low Income;Medicaid;Medicare   Uninsured Patient ([facility] Oncologist) No   Patient referred to apply for the following financial assistance Not Applicable   Food insecurities addressed Not Applicable   Transportation assistance No   Assistance securing medications No   Educational Programmer, multimedia the healthcare system     Encounter Details   Primary purpose of visit [place]   Was an Emergency Department visit averted? Not Applicable   Does patient have a medical provider? Yes   Patient referred to Not Applicable   Was a mental health screening completed? (GAINS tool) No   Does patient have dental issues? No   Does patient have vision issues? No   Does your patient have an abnormal blood pressure today? No   Since previous encounter, have you referred patient for abnormal blood pressure that resulted in a new diagnosis or medication change? No   Does your patient have an abnormal blood glucose today? No   Since previous encounter, have you referred patient for abnormal blood glucose that resulted in a new diagnosis or medication change? No   Was there a life-saving intervention made? No

## 2017-03-20 NOTE — Congregational Nurse Program (Signed)
Congregational Nurse Program Note  Date of Encounter: 03/20/2017  Past Medical History: Past Medical History:  Diagnosis Date  . Gastritis   . Hyperlipemia   . Hypertension   . Iron deficiency anemia     Encounter Details: Patient came to CN office.  She has a colonoscopy scheduled for 03/25/2017.  With the help of [name] interpreter we reviewed patient instructions and information for procedure.  She voiced understanding instructions.  Jake Michaelis RN, Congregational Nurse [phone]     CNP Questionnaire - 03/20/17 2000      Patient Demographics   Is this a new or existing patient? Existing   Patient is considered a/an Refugee   Race Asian     Patient Assistance   Location of Patient Assistance Not Applicable   Patient's financial/insurance status Low Income;Medicaid;Medicare   Uninsured Patient ([place] Card/Care Connects) No   Patient referred to apply for the following financial assistance Not Applicable   Food insecurities addressed Not Applicable   Transportation assistance No   Assistance securing medications No   Educational health offerings Health literacy;Other     Encounter Details   Primary purpose of visit Navigating the Healthcare System   Was an Emergency Department visit averted? Not Applicable   Does patient have a medical provider? Yes   Patient referred to Not Applicable   Was a mental health screening completed? (GAINS tool) No   Does patient have dental issues? No   Does patient have vision issues? No   Does your patient have an abnormal blood pressure today? No   Since previous encounter, have you referred patient for abnormal blood pressure that resulted in a new diagnosis or medication change? No   Does your patient have an abnormal blood glucose today? No   Since previous encounter, have you referred patient for abnormal blood glucose that resulted in a new diagnosis or medication change? No   Was there a life-saving intervention made?  No

## 2017-05-01 NOTE — Congregational Nurse Program (Signed)
Congregational Nurse Program Note  Date of Encounter: 05/01/2017  Past Medical History: Past Medical History:  Diagnosis Date  . Gastritis   . Hyperlipemia   . Hypertension   . Iron deficiency anemia     Encounter Details:  Patient came to CN office.  Interpreter [name] assisted.  Patient states she needs help making an appointment with her eye doctor.  She thinks it has been several years since she had an eye exam.  Phone call to [facility] at [place].  [phone]).  Appointment scheduled for 05/16/2017 at 10:20 am.  They will request a [place] interpreter.  Jake Michaelis RN, Congregational nurse [phone].     CNP Questionnaire - 05/01/17 1937      Patient Demographics   Is this a new or existing patient? Existing   Patient is considered a/an Refugee   Race Asian     Patient Assistance   Location of Patient Assistance Not Applicable   Patient's financial/insurance status Low Income;Medicaid;Medicare   Uninsured Patient ([facility] Oncologist) No   Patient referred to apply for the following financial assistance Not Applicable   Food insecurities addressed Not Applicable   Transportation assistance No   Assistance securing medications No   Educational Programmer, multimedia the healthcare system     Encounter Details   Primary purpose of visit [place]   Was an Emergency Department visit averted? Not Applicable   Does patient have a medical provider? Yes   Patient referred to Not Applicable   Was a mental health screening completed? (GAINS tool) No   Does patient have dental issues? No   Does patient have vision issues? No   Was a vision referral made? No   Does your patient have an abnormal blood pressure today? No   Since previous encounter, have you referred patient for abnormal blood pressure that resulted in a new diagnosis or medication change? No   Does your patient have an abnormal blood  glucose today? No   Since previous encounter, have you referred patient for abnormal blood glucose that resulted in a new diagnosis or medication change? No   Was there a life-saving intervention made? No

## 2017-08-21 ENCOUNTER — Other Ambulatory Visit: Payer: Self-pay | Admitting: Family Medicine

## 2017-08-21 DIAGNOSIS — Z1231 Encounter for screening mammogram for malignant neoplasm of breast: Secondary | ICD-10-CM

## 2017-08-21 NOTE — Congregational Nurse Program (Signed)
Congregational Nurse Program Note  Date of Encounter: 08/21/2017  Past Medical History: Past Medical History:  Diagnosis Date  . Gastritis   . Hyperlipemia   . Hypertension   . Iron deficiency anemia     Encounter Details:  Patient came to CN office with letter stating she needs to schedule Mammogram appointment.  CN called [facility] and made appointment for 09/02/2017 at 9:30 am. Jake Michaelis RN, Congregational Nurse [phone]       CNP Questionnaire - 08/21/17 1753      Patient Demographics   Is this a new or existing patient? Existing   Patient is considered a/an Refugee   Race Asian     Patient Assistance   Location of Patient Assistance Not Applicable   Patient's financial/insurance status Low Income;Medicaid;Medicare   Uninsured Patient ([facility] Oncologist) No   Patient referred to apply for the following financial assistance Not Applicable   Food insecurities addressed Not Applicable   Transportation assistance No   Assistance securing medications No   Educational health offerings Not Applicable     Encounter Details   Primary purpose of visit [place]   Was an Emergency Department visit averted? Not Applicable   Does patient have a medical provider? Yes   Patient referred to Not Applicable   Was a mental health screening completed? (GAINS tool) No   Does patient have dental issues? No   Does patient have vision issues? No   Does your patient have an abnormal blood pressure today? No   Since previous encounter, have you referred patient for abnormal blood pressure that resulted in a new diagnosis or medication change? No   Does your patient have an abnormal blood glucose today? No   Since previous encounter, have you referred patient for abnormal blood glucose that resulted in a new diagnosis or medication change? No   Was there a life-saving intervention made? No

## 2017-09-02 ENCOUNTER — Ambulatory Visit
Admission: RE | Admit: 2017-09-02 | Discharge: 2017-09-02 | Disposition: A | Discharge status: Home / Self Care | Payer: Medicare Other | Source: Ambulatory Visit | Attending: Family Medicine | Admitting: Family Medicine

## 2017-09-02 DIAGNOSIS — Z1231 Encounter for screening mammogram for malignant neoplasm of breast: Secondary | ICD-10-CM

## 2017-09-18 NOTE — Congregational Nurse Program (Signed)
Congregational Nurse Program Note  Date of Encounter: 09/18/2017  Past Medical History: Past Medical History:  Diagnosis Date  . Gastritis   . Hyperlipemia   . Hypertension   . Iron deficiency anemia     Encounter Details:  CN office visit with interpreter [name] assisting.  Patient brought letter with mammogram results and needed it interpreted.   She was relieved to know that no problems were found.  Jake Michaelis RN, [phone]     CNP Questionnaire - 09/18/17 1635      Patient Demographics   Is this a new or existing patient? Existing   Patient is considered a/an Refugee   Race Asian     Patient Assistance   Location of Patient Assistance Not Applicable   Patient's financial/insurance status Low Income;Medicaid;Medicare   Uninsured Patient ([facility]) No   Patient referred to apply for the following financial assistance Not Applicable   Food insecurities addressed Not Applicable   Transportation assistance No   Assistance securing medications No   Educational health offerings Health literacy     Encounter Details   Primary purpose of visit Other   Was an Emergency Department visit averted? Not Applicable   Does patient have a medical provider? Yes   Patient referred to Not Applicable   Was a mental health screening completed? (GAINS tool) No   Does patient have dental issues? No   Does patient have vision issues? No   Does your patient have an abnormal blood pressure today? No   Since previous encounter, have you referred patient for abnormal blood pressure that resulted in a new diagnosis or medication change? No   Does your patient have an abnormal blood glucose today? No   Since previous encounter, have you referred patient for abnormal blood glucose that resulted in a new diagnosis or medication change? No   Was there a life-saving intervention made? No

## 2017-10-30 NOTE — Congregational Nurse Program (Signed)
Congregational Nurse Program Note  Date of Encounter: 10/30/2017  Past Medical History: Past Medical History:  Diagnosis Date  . Gastritis   . Hyperlipemia   . Hypertension   . Iron deficiency anemia     Encounter Details: CN office visit with interpreter [name] assisting.  Patient states she has had lower abdominal pain and some dysuria for 3 days.  Appointment scheduled with [facility] for 11/06/2017 at 2:00 pm.  Jake Michaelis RN, Congregational Nurse [phone] CNP Questionnaire - 10/30/17 2220      Questionnaire   Patient Status  Refugee    Race  Asian    Location Patient Served At  Not Applicable    Insurance  Medicaid;Medicare    Uninsured  Not Applicable    Food  No food insecurities    Housing/Utilities  Yes, have permanent housing    Transportation  No transportation needs    Interpersonal Safety  Yes, feel physically and emotionally safe where you currently live    Medication  No medication insecurities    Medical Provider  Yes    Referrals  Primary Care Provider/Clinic    ED Visit Averted  Not Applicable    Life-Saving Intervention Made  Not Applicable

## 2017-11-01 ENCOUNTER — Encounter (HOSPITAL_COMMUNITY): Payer: Self-pay

## 2017-11-01 ENCOUNTER — Emergency Department (HOSPITAL_COMMUNITY)
Admission: EM | Admit: 2017-11-01 | Discharge: 2017-11-01 | Disposition: A | Discharge status: Home / Self Care | Payer: Medicare Other | Attending: Emergency Medicine | Admitting: Emergency Medicine

## 2017-11-01 ENCOUNTER — Other Ambulatory Visit: Payer: Self-pay

## 2017-11-01 DIAGNOSIS — Z79899 Other long term (current) drug therapy: Secondary | ICD-10-CM | POA: Insufficient documentation

## 2017-11-01 DIAGNOSIS — N3001 Acute cystitis with hematuria: Secondary | ICD-10-CM | POA: Diagnosis not present

## 2017-11-01 DIAGNOSIS — I10 Essential (primary) hypertension: Secondary | ICD-10-CM | POA: Diagnosis not present

## 2017-11-01 DIAGNOSIS — R300 Dysuria: Secondary | ICD-10-CM | POA: Diagnosis present

## 2017-11-01 LAB — URINALYSIS, ROUTINE W REFLEX MICROSCOPIC
BILIRUBIN URINE: NEGATIVE
GLUCOSE, UA: NEGATIVE mg/dL
Ketones, ur: NEGATIVE mg/dL
Nitrite: NEGATIVE
PH: 7 (ref 5.0–8.0)
Protein, ur: NEGATIVE mg/dL
Specific Gravity, Urine: 1.003 — ABNORMAL LOW (ref 1.005–1.030)

## 2017-11-01 MED ORDER — CEPHALEXIN 500 MG PO CAPS: 500.0000 mg | ORAL_CAPSULE | Freq: Four times a day (QID) | ORAL | 0 refills | Status: AC

## 2017-11-01 MED ORDER — CEPHALEXIN 250 MG PO CAPS
500.0000 mg | ORAL_CAPSULE | Freq: Once | ORAL | Status: AC
Start: 2017-11-01 — End: 2017-11-01
  Administered 2017-11-01: 500 mg via ORAL
  Filled 2017-11-01: qty 2

## 2017-11-01 MED ORDER — PHENAZOPYRIDINE HCL 95 MG PO TABS: 95.0000 mg | ORAL_TABLET | Freq: Three times a day (TID) | ORAL | 0 refills | Status: DC | PRN

## 2017-11-01 NOTE — Discharge Instructions (Signed)
Please take all of your antibiotics until finished!   You may develop abdominal discomfort or diarrhea from the antibiotic.  You may help offset this with probiotics which you can buy or get in yogurt. Do not eat  or take the probiotics until 2 hours after your antibiotic.   Stay very well hydrated with plenty of water throughout the day. Use pyridium as directed to decrease painful urination but know that a common side effect is to turn your urine a bright orange/red color. This is not a harmful side effect. Follow up with primary care physician in 1 week for recheck of ongoing symptoms and recheck of your blood pressure.  Continue taking her blood pressure medications as prescribed at home.  Return to the ED immediately for any concerning signs or symptoms develop such as fever, worsening pain, or spread of pain up the back as this may be a sign of a worsening infection.

## 2017-11-01 NOTE — ED Provider Notes (Signed)
[facility] EMERGENCY DEPARTMENT Provider Note   CSN: [id number] Arrival date & time: 11/01/17  0818     History   Chief Complaint Chief Complaint  Patient presents with  . Dysuria    HPI Brianna Camacho is a 66 y.o. female with history of gastritis, HLD, HTN presents today with chief complaint acute onset, progressively worsening dysuria and suprapubic abdominal pain for 1 week.  Patient endorses burning with urination as well as dull aching suprapubic pain which does not radiate.  She endorses urinary frequency and urgency.  Denies hematuria.  Denies nausea, vomiting, fevers, chills, chest pain, shortness of breath, melena, hematochezia, or vaginal itching, discharge, or bleeding.  She has not tried anything for her symptoms.  She has had symptoms like this in the past 6 years ago which she was told was a UTI.   Patient is primarily [place] speaking and a translator was used to obtain history and physical examination.  The history is provided by the patient. The history is limited by a language barrier. A language interpreter was used.    Past Medical History:  Diagnosis Date  . Gastritis   . Hyperlipemia   . Hypertension   . Iron deficiency anemia     Patient Active Problem List   Diagnosis Date Noted  . HTN (hypertension) 03/06/2012  . Dyslipidemia 03/06/2012  . Upper GIB (gastrointestinal bleeding) 03/06/2012  . Weakness generalized 03/06/2012  . Anemia 03/05/2012  . Peptic ulcer disease 03/05/2012    Past Surgical History:  Procedure Laterality Date  . CESAREAN SECTION    . ESOPHAGOGASTRODUODENOSCOPY  03/06/2012   Procedure: ESOPHAGOGASTRODUODENOSCOPY (EGD);  Surgeon: Beryle Beams, MD;  Location: [facility] ENDOSCOPY;  Service: Endoscopy;  Laterality: N/A;  . HOT HEMOSTASIS  03/06/2012   Procedure: HOT HEMOSTASIS (ARGON PLASMA COAGULATION/BICAP);  Surgeon: Beryle Beams, MD;  Location: [facility];  Service: Endoscopy;;    OB History    No data  available       Home Medications    Prior to Admission medications   Medication Sig Start Date End Date Taking? Authorizing Provider  cephALEXin (KEFLEX) 500 MG capsule Take 1 capsule (500 mg total) by mouth 4 (four) times daily for 10 days. 11/01/17 11/11/17  Rodell Perna A, PA-C  Choline Fenofibrate (TRILIPIX) 135 MG capsule Take 135 mg by mouth daily.    [provider]  esomeprazole (NEXIUM) 40 MG capsule Take 40 mg by mouth daily before breakfast.    [provider]  guaiFENesin (ROBITUSSIN) 100 MG/5ML liquid Take 5-10 mLs (100-200 mg total) by mouth every 4 (four) hours as needed for cough. 02/28/14   Domenic Moras, PA-C  olmesartan (BENICAR) 40 MG tablet Take 40 mg by mouth daily.    [provider]  phenazopyridine (PYRIDIUM) 95 MG tablet Take 1 tablet (95 mg total) by mouth 3 (three) times daily as needed for pain. 11/01/17   Edmon Magid A, PA-C  rosuvastatin (CRESTOR) 20 MG tablet Take 20 mg by mouth daily.    [provider]    Family History No family history on file.  Social History Social History   Tobacco Use  . Smoking status: Never Smoker  Substance Use Topics  . Alcohol use: No  . Drug use: No     Allergies   Patient has no known allergies.   Review of Systems Review of Systems  Constitutional: Negative for chills and fever.  Respiratory: Negative for shortness of breath.   Cardiovascular: Negative  for chest pain.  Gastrointestinal: Positive for abdominal pain. Negative for diarrhea, nausea and vomiting.  Genitourinary: Positive for dysuria, frequency and urgency. Negative for hematuria, vaginal bleeding, vaginal discharge and vaginal pain.  All other systems reviewed and are negative.    Physical Exam Updated Vital Signs BP (!) 167/93 (BP Location: Right Arm)   Pulse 83   Temp 98.9 F (37.2 C) (Oral)   Resp 16   SpO2 100%   Physical Exam  Constitutional: She appears well-developed and well-nourished. No  distress.  Resting comfortably in bed, well-appearing  HENT:  Head: Normocephalic and atraumatic.  Eyes: Conjunctivae are normal. Right eye exhibits no discharge. Left eye exhibits no discharge.  Neck: Normal range of motion. Neck supple. No JVD present. No tracheal deviation present.  Cardiovascular: Normal rate, regular rhythm, normal heart sounds and intact distal pulses.  Pulmonary/Chest: Effort normal and breath sounds normal.  Abdominal: Soft. Bowel sounds are normal. She exhibits no distension. There is tenderness.  Suprapubic tenderness, Murphy sign absent, Rovsing's absent, no CVA tenderness, no tenderness to palpation at McBurney's point  Musculoskeletal: Normal range of motion. She exhibits no edema.  Neurological: She is alert.  Fluent speech, no facial droop  Skin: Skin is warm and dry. No erythema.  Psychiatric: She has a normal mood and affect. Her behavior is normal.  Nursing note and vitals reviewed.    ED Treatments / Results  Labs (all labs ordered are listed, but only abnormal results are displayed) Labs Reviewed  URINALYSIS, ROUTINE W REFLEX MICROSCOPIC - Abnormal; Notable for the following components:      Result Value   APPearance HAZY (*)    Specific Gravity, Urine 1.003 (*)    Hgb urine dipstick MODERATE (*)    Leukocytes, UA LARGE (*)    Bacteria, UA MANY (*)    Squamous Epithelial / LPF 0-5 (*)    All other components within normal limits  URINE CULTURE    EKG  EKG Interpretation None       Radiology No results found.  Procedures Procedures (including critical care time)  Medications Ordered in ED Medications  cephALEXin (KEFLEX) capsule 500 mg (not administered)     Initial Impression / Assessment and Plan / ED Course  I have reviewed the triage vital signs and the nursing notes.  Pertinent labs & imaging results that were available during my care of the patient were reviewed by me and considered in my medical decision making (see  chart for details).     Patient with dysuria and other urinary symptoms for 1 week.  Afebrile, hypertensive while in the ED with some improvement.  She states she is taking her blood pressure medications and I recommended follow-up with her primary care physician for medication management which she is agreeable to.  Pt has been diagnosed with a UTI.  No vaginal complaints so I doubt p PID, TOA, or ovarian torsion especially in her age group.  Pt no evidence of obstruction, perforation, or other acute surgical abdominal pathology. No CVA tenderness, denies N/V.  UA consistent with UTI, low suspicion of nephrolithiasis.  Low suspicion of pyelonephritis in the absence of CVA tenderness and with no fever.  Will discharge with Keflex and Pyridium.  Discussed indications for return to the ED.  She will follow-up with her primary care physician for reevaluation of her symptoms and for blood pressure check.  Patient and patient's family verbalized understanding of and agreement with plan and patient stable for discharge home at  this time.  Patient seen and evaluated by Dr. Vanita Panda who agrees with assessment and plan at this time.  Final Clinical Impressions(s) / ED Diagnoses   Final diagnoses:  Acute cystitis with hematuria    ED Discharge Orders        Ordered    cephALEXin (KEFLEX) 500 MG capsule  4 times daily     11/01/17 1115    phenazopyridine (PYRIDIUM) 95 MG tablet  3 times daily PRN     11/01/17 1115       Renita Papa, PA-C 11/01/17 1142    Carmin Muskrat, MD 11/01/17 1553

## 2017-11-01 NOTE — ED Triage Notes (Signed)
Pt presents for evaluation of dysuria x 1 week.

## 2017-11-03 LAB — URINE CULTURE: Culture: 20000 — AB

## 2017-11-04 ENCOUNTER — Telehealth: Payer: Self-pay | Admitting: Emergency Medicine

## 2017-11-04 NOTE — Telephone Encounter (Signed)
Post ED Visit - Positive Culture Follow-up  Culture report reviewed by antimicrobial stewardship pharmacist:  []  Elenor Quinones, Pharm.D. []  Heide Guile, Pharm.D., BCPS AQ-ID []  Parks Neptune, Pharm.D., BCPS []  Alycia Rossetti, Pharm.D., BCPS []  [place], Pharm.D., BCPS, AAHIVP []  Legrand Como, Pharm.D., BCPS, AAHIVP []  Salome Arnt, PharmD, BCPS []  Dimitri Ped, PharmD, BCPS []  Vincenza Hews, PharmD, BCPS Ulanda Edison PharmD  Positive urine culture Treated with cephalexin, organism sensitive to the same and no further patient follow-up is required at this time.  [name] 11/04/2017, 10:33 AM

## 2017-11-19 ENCOUNTER — Emergency Department (HOSPITAL_COMMUNITY)
Admission: EM | Admit: 2017-11-19 | Discharge: 2017-11-19 | Disposition: A | Discharge status: Home / Self Care | Payer: Medicare Other | Attending: Emergency Medicine | Admitting: Emergency Medicine

## 2017-11-19 ENCOUNTER — Emergency Department (HOSPITAL_COMMUNITY): Payer: Medicare Other

## 2017-11-19 DIAGNOSIS — S52502A Unspecified fracture of the lower end of left radius, initial encounter for closed fracture: Secondary | ICD-10-CM

## 2017-11-19 DIAGNOSIS — Z79899 Other long term (current) drug therapy: Secondary | ICD-10-CM | POA: Diagnosis not present

## 2017-11-19 DIAGNOSIS — I10 Essential (primary) hypertension: Secondary | ICD-10-CM | POA: Diagnosis not present

## 2017-11-19 DIAGNOSIS — Y999 Unspecified external cause status: Secondary | ICD-10-CM | POA: Diagnosis not present

## 2017-11-19 DIAGNOSIS — Y9301 Activity, walking, marching and hiking: Secondary | ICD-10-CM | POA: Diagnosis not present

## 2017-11-19 DIAGNOSIS — Y929 Unspecified place or not applicable: Secondary | ICD-10-CM | POA: Insufficient documentation

## 2017-11-19 DIAGNOSIS — S6992XA Unspecified injury of left wrist, hand and finger(s), initial encounter: Secondary | ICD-10-CM | POA: Diagnosis present

## 2017-11-19 DIAGNOSIS — W000XXA Fall on same level due to ice and snow, initial encounter: Secondary | ICD-10-CM | POA: Insufficient documentation

## 2017-11-19 MED ORDER — HYDROCODONE-ACETAMINOPHEN 5-325 MG PO TABS
1.0000 | ORAL_TABLET | ORAL | Status: AC
  Administered 2017-11-19: 1 via ORAL
  Filled 2017-11-19: qty 1

## 2017-11-19 MED ORDER — HYDROCODONE-ACETAMINOPHEN 5-325 MG PO TABS: 1.0000 | ORAL_TABLET | ORAL | 0 refills | Status: DC | PRN

## 2017-11-19 NOTE — ED Provider Notes (Signed)
[facility] DEPT Provider Note   CSN: [id number] Arrival date & time: 11/19/17  1134     History   Chief Complaint No chief complaint on file.   HPI Thu Gwendolyne Welford is a 66 y.o. female.  Patient was walking today when she slipped and fell landing on her left wrist.  Patient is having pain and swelling in her left wrist.  She denies any other injuries   The history is provided by the patient.  Wrist Pain  This is a new problem. The current episode started less than 1 hour ago. The problem occurs constantly. The problem has not changed since onset.Pertinent negatives include no chest pain, no abdominal pain, no headaches and no shortness of breath. Exacerbated by: movement. Nothing relieves the symptoms. She has tried nothing for the symptoms.    Past Medical History:  Diagnosis Date  . Gastritis   . Hyperlipemia   . Hypertension   . Iron deficiency anemia     Patient Active Problem List   Diagnosis Date Noted  . HTN (hypertension) 03/06/2012  . Dyslipidemia 03/06/2012  . Upper GIB (gastrointestinal bleeding) 03/06/2012  . Weakness generalized 03/06/2012  . Anemia 03/05/2012  . Peptic ulcer disease 03/05/2012    Past Surgical History:  Procedure Laterality Date  . CESAREAN SECTION    . ESOPHAGOGASTRODUODENOSCOPY  03/06/2012   Procedure: ESOPHAGOGASTRODUODENOSCOPY (EGD);  Surgeon: Beryle Beams, MD;  Location: [facility] ENDOSCOPY;  Service: Endoscopy;  Laterality: N/A;  . HOT HEMOSTASIS  03/06/2012   Procedure: HOT HEMOSTASIS (ARGON PLASMA COAGULATION/BICAP);  Surgeon: Beryle Beams, MD;  Location: [facility];  Service: Endoscopy;;    OB History    No data available       Home Medications    Prior to Admission medications   Medication Sig Start Date End Date Taking? Authorizing Provider  acetaminophen (TYLENOL) 500 MG tablet Take 500 mg by mouth daily as needed for mild pain.   Yes [provider]  esomeprazole (NEXIUM) 40  MG capsule Take 40 mg by mouth daily before breakfast.   Yes [provider]  Liniments (SALONPAS PAIN RELIEF PATCH EX) Apply 1 patch topically daily as needed (pain).   Yes [provider]  lisinopril (PRINIVIL,ZESTRIL) 40 MG tablet Take 40 mg by mouth daily. 08/21/17  Yes [provider]  rosuvastatin (CRESTOR) 20 MG tablet Take 20 mg by mouth daily.   Yes [provider]  guaiFENesin (ROBITUSSIN) 100 MG/5ML liquid Take 5-10 mLs (100-200 mg total) by mouth every 4 (four) hours as needed for cough. Patient not taking: Reported on 11/19/2017 02/28/14   Domenic Moras, PA-C  phenazopyridine (PYRIDIUM) 95 MG tablet Take 1 tablet (95 mg total) by mouth 3 (three) times daily as needed for pain. Patient not taking: Reported on 11/19/2017 11/01/17   Renita Papa, PA-C    Family History No family history on file.  Social History Social History   Tobacco Use  . Smoking status: Never Smoker  Substance Use Topics  . Alcohol use: No  . Drug use: No     Allergies   Patient has no known allergies.   Review of Systems Review of Systems  Respiratory: Negative for shortness of breath.   Cardiovascular: Negative for chest pain.  Gastrointestinal: Negative for abdominal pain.  Neurological: Negative for headaches.  All other systems reviewed and are negative.    Physical Exam Updated Vital Signs BP (!) 163/103   Pulse 94   Temp 98.2 F (  36.8 C) (Oral)   Resp 16   SpO2 98%   Physical Exam  Constitutional: She appears well-developed and well-nourished. No distress.  HENT:  Head: Normocephalic and atraumatic.  Right Ear: External ear normal.  Left Ear: External ear normal.  Eyes: Conjunctivae are normal. Right eye exhibits no discharge. Left eye exhibits no discharge. No scleral icterus.  Neck: Neck supple. No tracheal deviation present.  Cardiovascular: Normal rate.  Pulmonary/Chest: Effort normal. No stridor. No respiratory distress.  Abdominal:  She exhibits no distension.  Musculoskeletal: She exhibits tenderness. She exhibits no edema.       Left wrist: She exhibits tenderness, bony tenderness, swelling and deformity.       Cervical back: She exhibits no tenderness, no bony tenderness and no swelling.  Neurological: She is alert. Cranial nerve deficit: no gross deficits.  Skin: Skin is warm and dry. No rash noted.  Psychiatric: She has a normal mood and affect.  Nursing note and vitals reviewed.    ED Treatments / Results  Labs (all labs ordered are listed, but only abnormal results are displayed) Labs Reviewed - No data to display   Radiology Dg Wrist Complete Left  Result Date: 11/19/2017 CLINICAL DATA:  Patient fell at home this morning. EXAM: LEFT WRIST - COMPLETE 3+ VIEW COMPARISON:  None in PACs FINDINGS: The patient has sustained a comminuted impacted fracture of the distal radial metaphysis which reaches the articular surface. There is mild dorsal displacement of the distal fracture fragment. The adjacent ulna is intact. The carpal bones and visualized portions of the metacarpals are intact. There is mild diffuse soft tissue swelling. IMPRESSION: There is an acute impacted, comminuted, intra-articular, angulated, and mildly displaced fracture of the distal left radial metaphysis. Electronically Signed   By: David  Martinique M.D.   On: 11/19/2017 13:50    Procedures Procedures (including critical care time)  Medications Ordered in ED Medications  HYDROcodone-acetaminophen (NORCO/VICODIN) 5-325 MG per tablet 1 tablet (1 tablet Oral Given 11/19/17 1328)     Initial Impression / Assessment and Plan / ED Course  I have reviewed the triage vital signs and the nursing notes.  Pertinent labs & imaging results that were available during my care of the patient were reviewed by me and considered in my medical decision making (see chart for details).   Patient's x-ray shows a comminuted distal radius fracture.  Will place  patient in a splint.  Will consult with orthopedic hand surgery regarding outpatient follow-up.  Final Clinical Impressions(s) / ED Diagnoses   Final diagnoses:  Closed fracture of distal end of left radius, unspecified fracture morphology, initial encounter    ED Discharge Orders    None       Dorie Rank, MD 11/20/17 1355

## 2017-11-19 NOTE — ED Triage Notes (Signed)
Pt arrived to [facility] via [place] after a fall at home this morning. Pt presents with wrist injury, but was able to walk to the EMS truck with no problems. Pt speaks [place] and speaks very little [place].

## 2017-11-19 NOTE — ED Notes (Signed)
ED Provider at bedside. 

## 2017-11-19 NOTE — Consult Note (Signed)
Reason for Consult:Left wrist fx Referring Physician: Hillard Danker  Brianna Camacho is an 66 y.o. female.  HPI: Brianna fell on the ice today backward and broke her fall with her left wrist. She had immediate pain and came to the ED for evaluation. X-rays showed a distal radius fx and hand surgery was consulted. She only speaks [place] and visit conducted with aid of interpreter.  Past Medical History:  Diagnosis Date  . Gastritis   . Hyperlipemia   . Hypertension   . Iron deficiency anemia     Past Surgical History:  Procedure Laterality Date  . CESAREAN SECTION    . ESOPHAGOGASTRODUODENOSCOPY  03/06/2012   Procedure: ESOPHAGOGASTRODUODENOSCOPY (EGD);  Surgeon: Beryle Beams, MD;  Location: [facility] ENDOSCOPY;  Service: Endoscopy;  Laterality: N/A;  . HOT HEMOSTASIS  03/06/2012   Procedure: HOT HEMOSTASIS (ARGON PLASMA COAGULATION/BICAP);  Surgeon: Beryle Beams, MD;  [facility] ENDOSCOPY;  Service: Endoscopy;;    No family history on file.  Social History:  reports that  has never smoked. She does not have any smokeless tobacco history on file. She reports that she does not drink alcohol or use drugs.  Allergies: No Known Allergies  Medications: I have reviewed the patient's current medications.  No results found for this or any previous visit (from the past 48 hour(s)).  Dg Wrist Complete Left  Result Date: 11/19/2017 CLINICAL DATA:  Patient fell at home this morning. EXAM: LEFT WRIST - COMPLETE 3+ VIEW COMPARISON:  None in PACs FINDINGS: The patient has sustained a comminuted impacted fracture of the distal radial metaphysis which reaches the articular surface. There is mild dorsal displacement of the distal fracture fragment. The adjacent ulna is intact. The carpal bones and visualized portions of the metacarpals are intact. There is mild diffuse soft tissue swelling. IMPRESSION: There is an acute impacted, comminuted, intra-articular, angulated, and mildly displaced fracture of the  distal left radial metaphysis. Electronically Signed   By: David  Martinique M.D.   On: 11/19/2017 13:50    Review of Systems  Constitutional: Negative for weight loss.  HENT: Negative for ear discharge, ear pain, hearing loss and tinnitus.   Eyes: Negative for blurred vision, double vision, photophobia and pain.  Respiratory: Negative for cough, sputum production and shortness of breath.   Cardiovascular: Negative for chest pain.  Gastrointestinal: Negative for abdominal pain, nausea and vomiting.  Genitourinary: Negative for dysuria, flank pain, frequency and urgency.  Musculoskeletal: Positive for joint pain (Left wrist ). Negative for back pain, falls, myalgias and neck pain.  Neurological: Negative for dizziness, tingling, sensory change, focal weakness, loss of consciousness and headaches.  Endo/Heme/Allergies: Does not bruise/bleed easily.  Psychiatric/Behavioral: Negative for depression, memory loss and substance abuse. The patient is not nervous/anxious.    Blood pressure (!) 160/95, pulse 95, temperature 98.2 F (36.8 C), temperature source Oral, resp. rate 18, SpO2 98 %. Physical Exam  Constitutional: She appears well-developed and well-nourished. No distress.  HENT:  Head: Normocephalic.  Eyes: Conjunctivae are normal. Right eye exhibits no discharge. Left eye exhibits no discharge. No scleral icterus.  Neck: Normal range of motion.  Cardiovascular: Normal rate and regular rhythm.  Respiratory: Effort normal. No respiratory distress.  Musculoskeletal:  Left shoulder, elbow, wrist, digits- no skin wounds, mod TTP wrist, swollen  Sens  Ax/R/M/U intact  Mot   Ax/ R/ PIN/ M/ AIN/ U intact  Rad 2+  Neurological: She is alert.  Skin: Skin is warm and dry. She is not diaphoretic.  Psychiatric: She has a normal mood and affect. Her behavior is normal.    Assessment/Plan: Fall Left wrist fx -- Splint, NWB, ice, and elevation. F/u with Dr. Amedeo Plenty in office Thursday morning at  0800. Likely ORIF on Saturday.    Lisette Abu, PA-C Orthopedic Surgery [phone] 11/19/2017, 4:06 PM

## 2017-11-19 NOTE — Discharge Instructions (Signed)
Keep left arm elevated when possible.  Ice to wrist 30 minutes 4 times per day.  Keep splint intact and dry.  No lifting, pushing, or pulling with left arm.

## 2017-11-19 NOTE — ED Notes (Signed)
Ortho tech at bedside placing splint 

## 2017-11-19 NOTE — ED Notes (Signed)
Bed: QX47 Expected date:  Expected time:  Means of arrival:  Comments: EMS-fall/wrist deformity

## 2017-11-20 ENCOUNTER — Ambulatory Visit: Payer: Self-pay | Admitting: Orthopedic Surgery

## 2017-11-21 ENCOUNTER — Encounter (HOSPITAL_COMMUNITY): Payer: Self-pay | Admitting: *Deleted

## 2017-11-22 ENCOUNTER — Encounter (HOSPITAL_COMMUNITY): Payer: Self-pay | Admitting: *Deleted

## 2017-11-22 ENCOUNTER — Other Ambulatory Visit: Payer: Self-pay

## 2017-11-22 NOTE — Progress Notes (Signed)
Pt SDW-Pre-op call completed by pt and [place] # [phone]. Pt denies SOB, chest pain, and being under the care of a cardiologist. Pt denies having a stress test, echo and cardiac cath. Pt denies having an EKG and chest x ray within the last year. Pt denies recent labs. Pt made aware to stop taking  Aspirin vitamins, fish oil and herbal medications. Do not take any NSAIDs ie: Ibuprofen, Advil, Naproxen (Aleve), Motrin, BC and Goody Powder or any medication containing Aspirin. Pt verbalized understanding of all pre-op instructions.

## 2017-11-23 ENCOUNTER — Encounter (HOSPITAL_COMMUNITY)
Admission: RE | Disposition: A | Discharge status: Home / Self Care | Payer: Self-pay | Source: Ambulatory Visit | Attending: Orthopedic Surgery

## 2017-11-23 ENCOUNTER — Ambulatory Visit (HOSPITAL_COMMUNITY)
Admission: RE | Admit: 2017-11-23 | Discharge: 2017-11-23 | Disposition: A | Discharge status: Home / Self Care | Payer: Medicare Other | Source: Ambulatory Visit | Attending: Orthopedic Surgery | Admitting: Orthopedic Surgery

## 2017-11-23 ENCOUNTER — Ambulatory Visit (HOSPITAL_COMMUNITY): Payer: Medicare Other | Admitting: Anesthesiology

## 2017-11-23 ENCOUNTER — Encounter (HOSPITAL_COMMUNITY): Payer: Self-pay | Admitting: *Deleted

## 2017-11-23 DIAGNOSIS — Z79899 Other long term (current) drug therapy: Secondary | ICD-10-CM | POA: Diagnosis not present

## 2017-11-23 DIAGNOSIS — W19XXXA Unspecified fall, initial encounter: Secondary | ICD-10-CM | POA: Diagnosis not present

## 2017-11-23 DIAGNOSIS — I10 Essential (primary) hypertension: Secondary | ICD-10-CM | POA: Insufficient documentation

## 2017-11-23 DIAGNOSIS — E785 Hyperlipidemia, unspecified: Secondary | ICD-10-CM | POA: Diagnosis not present

## 2017-11-23 DIAGNOSIS — Z79891 Long term (current) use of opiate analgesic: Secondary | ICD-10-CM | POA: Insufficient documentation

## 2017-11-23 DIAGNOSIS — Z791 Long term (current) use of non-steroidal anti-inflammatories (NSAID): Secondary | ICD-10-CM | POA: Diagnosis not present

## 2017-11-23 DIAGNOSIS — S52572A Other intraarticular fracture of lower end of left radius, initial encounter for closed fracture: Secondary | ICD-10-CM | POA: Diagnosis present

## 2017-11-23 HISTORY — DX: Unspecified osteoarthritis, unspecified site: M19.90

## 2017-11-23 HISTORY — DX: Urinary tract infection, site not specified: N39.0

## 2017-11-23 HISTORY — PX: ORIF WRIST FRACTURE: SHX2133

## 2017-11-23 HISTORY — DX: Unspecified fracture of the lower end of left radius, initial encounter for closed fracture: S52.502A

## 2017-11-23 LAB — CBC
HEMATOCRIT: 39 % (ref 36.0–46.0)
Hemoglobin: 12.1 g/dL (ref 12.0–15.0)
MCH: 21.3 pg — ABNORMAL LOW (ref 26.0–34.0)
MCHC: 31 g/dL (ref 30.0–36.0)
MCV: 68.7 fL — AB (ref 78.0–100.0)
Platelets: 617 10*3/uL — ABNORMAL HIGH (ref 150–400)
RBC: 5.68 MIL/uL — AB (ref 3.87–5.11)
RDW: 16.2 % — ABNORMAL HIGH (ref 11.5–15.5)
WBC: 6.8 10*3/uL (ref 4.0–10.5)

## 2017-11-23 LAB — BASIC METABOLIC PANEL
Anion gap: 10 (ref 5–15)
BUN: 14 mg/dL (ref 6–20)
CHLORIDE: 105 mmol/L (ref 101–111)
CO2: 24 mmol/L (ref 22–32)
Calcium: 9.3 mg/dL (ref 8.9–10.3)
Creatinine, Ser: 1.02 mg/dL — ABNORMAL HIGH (ref 0.44–1.00)
GFR calc non Af Amer: 55 mL/min — ABNORMAL LOW (ref >60)
Glucose, Bld: 96 mg/dL (ref 65–99)
POTASSIUM: 4.2 mmol/L (ref 3.5–5.1)
SODIUM: 139 mmol/L (ref 135–145)

## 2017-11-23 SURGERY — OPEN REDUCTION INTERNAL FIXATION (ORIF) WRIST FRACTURE
Anesthesia: General | Site: Wrist | Laterality: Left

## 2017-11-23 MED ORDER — LACTATED RINGERS IV SOLN
INTRAVENOUS | Status: DC | PRN
  Administered 2017-11-23: 07:00:00 via INTRAVENOUS

## 2017-11-23 MED ORDER — CEFAZOLIN SODIUM-DEXTROSE 2-4 GM/100ML-% IV SOLN
2.0000 g | INTRAVENOUS | Status: AC
  Administered 2017-11-23: 2 g via INTRAVENOUS
  Filled 2017-11-23: qty 100

## 2017-11-23 MED ORDER — PROPOFOL 10 MG/ML IV BOLUS
INTRAVENOUS | Status: DC | PRN
  Administered 2017-11-23: 120 mg via INTRAVENOUS

## 2017-11-23 MED ORDER — BUPIVACAINE-EPINEPHRINE (PF) 0.5% -1:200000 IJ SOLN
INTRAMUSCULAR | Status: DC | PRN
  Administered 2017-11-23: 30 mL via PERINEURAL

## 2017-11-23 MED ORDER — 0.9 % SODIUM CHLORIDE (POUR BTL) OPTIME
TOPICAL | Status: DC | PRN
  Administered 2017-11-23: 1000 mL

## 2017-11-23 MED ORDER — EPHEDRINE 5 MG/ML INJ
INTRAVENOUS | Status: AC
  Filled 2017-11-23: qty 10

## 2017-11-23 MED ORDER — CHLORHEXIDINE GLUCONATE 4 % EX LIQD: 60.0000 mL | Freq: Once | CUTANEOUS | Status: DC

## 2017-11-23 MED ORDER — MIDAZOLAM HCL 2 MG/2ML IJ SOLN
INTRAMUSCULAR | Status: AC
  Filled 2017-11-23: qty 2

## 2017-11-23 MED ORDER — DEXAMETHASONE SODIUM PHOSPHATE 10 MG/ML IJ SOLN
INTRAMUSCULAR | Status: DC | PRN
  Administered 2017-11-23: 8 mg via INTRAVENOUS

## 2017-11-23 MED ORDER — ONDANSETRON HCL 4 MG/2ML IJ SOLN
INTRAMUSCULAR | Status: AC
  Filled 2017-11-23: qty 2

## 2017-11-23 MED ORDER — PROPOFOL 10 MG/ML IV BOLUS
INTRAVENOUS | Status: AC
  Filled 2017-11-23: qty 20

## 2017-11-23 MED ORDER — PHENYLEPHRINE HCL 10 MG/ML IJ SOLN
INTRAVENOUS | Status: DC | PRN
  Administered 2017-11-23: 30 ug/min via INTRAVENOUS

## 2017-11-23 MED ORDER — FENTANYL CITRATE (PF) 250 MCG/5ML IJ SOLN
INTRAMUSCULAR | Status: AC
  Filled 2017-11-23: qty 5

## 2017-11-23 MED ORDER — HYDROMORPHONE HCL 1 MG/ML IJ SOLN: 0.2500 mg | INTRAMUSCULAR | Status: DC | PRN

## 2017-11-23 MED ORDER — ROCURONIUM BROMIDE 10 MG/ML (PF) SYRINGE
PREFILLED_SYRINGE | INTRAVENOUS | Status: AC
  Filled 2017-11-23: qty 5

## 2017-11-23 MED ORDER — ONDANSETRON HCL 4 MG/2ML IJ SOLN
INTRAMUSCULAR | Status: DC | PRN
  Administered 2017-11-23: 4 mg via INTRAVENOUS

## 2017-11-23 MED ORDER — PHENYLEPHRINE 40 MCG/ML (10ML) SYRINGE FOR IV PUSH (FOR BLOOD PRESSURE SUPPORT)
PREFILLED_SYRINGE | INTRAVENOUS | Status: AC
  Filled 2017-11-23: qty 10

## 2017-11-23 MED ORDER — PROMETHAZINE HCL 25 MG/ML IJ SOLN: 6.2500 mg | INTRAMUSCULAR | Status: DC | PRN

## 2017-11-23 MED ORDER — SUCCINYLCHOLINE CHLORIDE 200 MG/10ML IV SOSY
PREFILLED_SYRINGE | INTRAVENOUS | Status: AC
  Filled 2017-11-23: qty 10

## 2017-11-23 MED ORDER — DEXAMETHASONE SODIUM PHOSPHATE 10 MG/ML IJ SOLN
INTRAMUSCULAR | Status: AC
  Filled 2017-11-23: qty 1

## 2017-11-23 MED ORDER — MIDAZOLAM HCL 5 MG/5ML IJ SOLN
INTRAMUSCULAR | Status: DC | PRN
  Administered 2017-11-23: 2 mg via INTRAVENOUS

## 2017-11-23 MED ORDER — FENTANYL CITRATE (PF) 100 MCG/2ML IJ SOLN
INTRAMUSCULAR | Status: DC | PRN
Start: 2017-11-23 — End: 2017-11-23
  Administered 2017-11-23 (×2): 50 ug via INTRAVENOUS

## 2017-11-23 SURGICAL SUPPLY — 62 items
BANDAGE ACE 3X5.8 VEL STRL LF (GAUZE/BANDAGES/DRESSINGS) ×3 IMPLANT
BANDAGE ACE 4X5 VEL STRL LF (GAUZE/BANDAGES/DRESSINGS) ×3 IMPLANT
BIT DRILL 2.2 SS TIBIAL (BIT) ×2 IMPLANT
BLADE CLIPPER SURG (BLADE) IMPLANT
BNDG CMPR 9X4 STRL LF SNTH (GAUZE/BANDAGES/DRESSINGS) ×1
BNDG ESMARK 4X9 LF (GAUZE/BANDAGES/DRESSINGS) ×3 IMPLANT
BNDG GAUZE ELAST 4 BULKY (GAUZE/BANDAGES/DRESSINGS) ×5 IMPLANT
CANISTER SUCT 3000ML PPV (MISCELLANEOUS) ×3 IMPLANT
CORDS BIPOLAR (ELECTRODE) ×3 IMPLANT
COVER SURGICAL LIGHT HANDLE (MISCELLANEOUS) ×3 IMPLANT
CUFF TOURNIQUET SINGLE 18IN (TOURNIQUET CUFF) ×3 IMPLANT
CUFF TOURNIQUET SINGLE 24IN (TOURNIQUET CUFF) IMPLANT
DRAIN TLS ROUND 10FR (DRAIN) IMPLANT
DRAPE OEC MINIVIEW 54X84 (DRAPES) ×2 IMPLANT
DRAPE SURG 17X23 STRL (DRAPES) ×3 IMPLANT
DRSG ADAPTIC 3X8 NADH LF (GAUZE/BANDAGES/DRESSINGS) ×2 IMPLANT
GAUZE SPONGE 4X4 12PLY STRL (GAUZE/BANDAGES/DRESSINGS) ×1 IMPLANT
GAUZE SPONGE 4X4 12PLY STRL LF (GAUZE/BANDAGES/DRESSINGS) ×2 IMPLANT
GAUZE XEROFORM 1X8 LF (GAUZE/BANDAGES/DRESSINGS) ×3 IMPLANT
GLOVE BIOGEL M 8.0 STRL (GLOVE) ×3 IMPLANT
GLOVE SS BIOGEL STRL SZ 8 (GLOVE) ×1 IMPLANT
GLOVE SUPERSENSE BIOGEL SZ 8 (GLOVE) ×2
GOWN STRL REUS W/ TWL LRG LVL3 (GOWN DISPOSABLE) ×1 IMPLANT
GOWN STRL REUS W/ TWL XL LVL3 (GOWN DISPOSABLE) ×2 IMPLANT
GOWN STRL REUS W/TWL LRG LVL3 (GOWN DISPOSABLE) ×3
GOWN STRL REUS W/TWL XL LVL3 (GOWN DISPOSABLE) ×6
KIT BASIN OR (CUSTOM PROCEDURE TRAY) ×3 IMPLANT
KIT ROOM TURNOVER OR (KITS) ×3 IMPLANT
LOOP VESSEL MAXI BLUE (MISCELLANEOUS) IMPLANT
MANIFOLD NEPTUNE II (INSTRUMENTS) ×1 IMPLANT
NEEDLE 22X1 1/2 (OR ONLY) (NEEDLE) ×2 IMPLANT
NS IRRIG 1000ML POUR BTL (IV SOLUTION) ×3 IMPLANT
PACK ORTHO EXTREMITY (CUSTOM PROCEDURE TRAY) ×3 IMPLANT
PAD ARMBOARD 7.5X6 YLW CONV (MISCELLANEOUS) ×6 IMPLANT
PAD CAST 3X4 CTTN HI CHSV (CAST SUPPLIES) ×1 IMPLANT
PAD CAST 4YDX4 CTTN HI CHSV (CAST SUPPLIES) ×1 IMPLANT
PADDING CAST COTTON 3X4 STRL (CAST SUPPLIES) ×3
PADDING CAST COTTON 4X4 STRL (CAST SUPPLIES) ×3
PEG LOCKING SMOOTH 2.2X18 (Peg) ×6 IMPLANT
PEG LOCKING SMOOTH 2.2X20 (Screw) ×4 IMPLANT
PILLOW ARM CARTER ADULT (MISCELLANEOUS) ×2 IMPLANT
PLATE NARROW DVR LEFT (Plate) ×2 IMPLANT
SCREW LOCK 16X2.7X 3 LD TPR (Screw) IMPLANT
SCREW LOCK 18X2.7X 3 LD TPR (Screw) IMPLANT
SCREW LOCKING 2.7X16 (Screw) ×12 IMPLANT
SCREW LOCKING 2.7X18 (Screw) ×6 IMPLANT
SCRUB BETADINE 4OZ XXX (MISCELLANEOUS) ×3 IMPLANT
SOL PREP POV-IOD 4OZ 10% (MISCELLANEOUS) ×3 IMPLANT
SPLINT FIBERGLASS 4X30 (CAST SUPPLIES) ×2 IMPLANT
SPONGE LAP 4X18 X RAY DECT (DISPOSABLE) IMPLANT
SUT MNCRL AB 4-0 PS2 18 (SUTURE) ×3 IMPLANT
SUT PROLENE 3 0 PS 2 (SUTURE) IMPLANT
SUT PROLENE 4 0 PS 2 18 (SUTURE) ×4 IMPLANT
SUT VIC AB 3-0 FS2 27 (SUTURE) ×2 IMPLANT
SYR CONTROL 10ML LL (SYRINGE) ×2 IMPLANT
SYSTEM CHEST DRAIN TLS 7FR (DRAIN) ×2 IMPLANT
TOWEL OR 17X24 6PK STRL BLUE (TOWEL DISPOSABLE) ×3 IMPLANT
TOWEL OR 17X26 10 PK STRL BLUE (TOWEL DISPOSABLE) ×3 IMPLANT
TUBE CONNECTING 12'X1/4 (SUCTIONS) ×1
TUBE CONNECTING 12X1/4 (SUCTIONS) ×2 IMPLANT
TUBE EVACUATION TLS (MISCELLANEOUS) ×3 IMPLANT
WATER STERILE IRR 1000ML POUR (IV SOLUTION) ×3 IMPLANT

## 2017-11-23 NOTE — Anesthesia Preprocedure Evaluation (Addendum)
Anesthesia Evaluation  Patient identified by MRN, date of birth, ID band Patient awake    Reviewed: Allergy & Precautions, NPO status , Patient's Chart, lab work & pertinent test results  Airway Mallampati: II  TM Distance: >3 FB Neck ROM: Full    Dental  (+) Poor Dentition, Dental Advisory Given, Edentulous Upper   Pulmonary neg pulmonary ROS,    Pulmonary exam normal        Cardiovascular hypertension, negative cardio ROS Normal cardiovascular exam     Neuro/Psych negative neurological ROS  negative psych ROS   GI/Hepatic Neg liver ROS, PUD,   Endo/Other  negative endocrine ROS  Renal/GU negative Renal ROS  negative genitourinary   Musculoskeletal negative musculoskeletal ROS (+)   Abdominal   Peds negative pediatric ROS (+)  Hematology negative hematology ROS (+)   Anesthesia Other Findings   Reproductive/Obstetrics negative OB ROS                            Anesthesia Physical Anesthesia Plan  ASA: II  Anesthesia Plan: General   Post-op Pain Management: GA combined w/ Regional for post-op pain   Induction: Intravenous  PONV Risk Score and Plan: 3 and Ondansetron, Dexamethasone, Diphenhydramine and Treatment may vary due to age or medical condition  Airway Management Planned: LMA  Additional Equipment:   Intra-op Plan:   Post-operative Plan: Extubation in OR  Informed Consent: I have reviewed the patients History and Physical, chart, labs and discussed the procedure including the risks, benefits and alternatives for the proposed anesthesia with the patient or authorized representative who has indicated his/her understanding and acceptance.   Dental advisory given  Plan Discussed with: Anesthesiologist, CRNA and Surgeon  Anesthesia Plan Comments:         Anesthesia Quick Evaluation

## 2017-11-23 NOTE — H&P (Signed)
Brianna Camacho is an 66 y.o. female.   Chief Complaint: left wrist fracture HPI: the patient is a 66 year old female who unfortunately sustained an injury to the left upper extremity after a fall. This resulted in a displaced left distal radius fracture. The patient was initially seen in emergency room setting and subsequently seen and evaluated in our office. We've discussed with her nature of her fracture and the degree of instability. We have recommended surgical endeavors to attempt to restore normal anatomic parameters. We discussed all issues with her via translator. The patient desires to proceed.  Past Medical History:  Diagnosis Date  . Arthritis   . Closed fracture of left distal radius   . Gastritis   . Hyperlipemia   . Hypertension   . Iron deficiency anemia   . UTI (urinary tract infection)     Past Surgical History:  Procedure Laterality Date  . CESAREAN SECTION    . ESOPHAGOGASTRODUODENOSCOPY  03/06/2012   Procedure: ESOPHAGOGASTRODUODENOSCOPY (EGD);  Surgeon: Beryle Beams, MD;  Location: [facility] ENDOSCOPY;  Service: Endoscopy;  Laterality: N/A;  . HAND SURGERY    . HOT HEMOSTASIS  03/06/2012   Procedure: HOT HEMOSTASIS (ARGON PLASMA COAGULATION/BICAP);  Surgeon: Beryle Beams, MD;  Location: [facility] ENDOSCOPY;  Service: Endoscopy;;    History reviewed. No pertinent family history. Social History:  reports that  has never smoked. she has never used smokeless tobacco. She reports that she does not drink alcohol or use drugs.  Allergies: No Known Allergies  Medications Prior to Admission  Medication Sig Dispense Refill  . acetaminophen (TYLENOL) 500 MG tablet Take 500 mg by mouth daily as needed for mild pain.    Marland Kitchen esomeprazole (NEXIUM) 40 MG capsule Take 40 mg by mouth daily before breakfast.    . HYDROcodone-acetaminophen (NORCO/VICODIN) 5-325 MG tablet Take 1 tablet by mouth every 4 (four) hours as needed. 16 tablet 0  . Liniments (SALONPAS PAIN RELIEF PATCH EX) Apply 1 patch  topically daily as needed (pain).    Marland Kitchen lisinopril (PRINIVIL,ZESTRIL) 40 MG tablet Take 40 mg by mouth daily.    . rosuvastatin (CRESTOR) 20 MG tablet Take 20 mg by mouth daily.    Marland Kitchen guaiFENesin (ROBITUSSIN) 100 MG/5ML liquid Take 5-10 mLs (100-200 mg total) by mouth every 4 (four) hours as needed for cough. (Patient not taking: Reported on 11/19/2017) 60 mL 0  . phenazopyridine (PYRIDIUM) 95 MG tablet Take 1 tablet (95 mg total) by mouth 3 (three) times daily as needed for pain. (Patient not taking: Reported on 11/19/2017) 10 tablet 0    No results found for this or any previous visit (from the past 48 hour(s)). No results found.  Review of Systems  Constitutional: Negative.   HENT: Negative.   Eyes: Negative.   Cardiovascular: Negative.   Gastrointestinal:       History of GERD  Genitourinary: Negative.        History of GERD  Musculoskeletal:       See history of present illness  Skin: Negative.   Neurological: Negative.     Blood pressure (!) 148/90, pulse 81, temperature 98.5 F (36.9 C), temperature source Oral, resp. rate 20, height 5\' 2"  (1.575 m), weight 59.9 kg (132 lb), SpO2 98 %. Physical Exam  The patient is alert and oriented in no acute distress. The patient complains of pain in the affected upper extremity.  The patient is noted to have a normal HEENT exam. Lung fields show equal chest expansion and no  shortness of breath. Abdomen exam is nontender without distention. Lower extremity examination does not show any fracture dislocation or blood clot symptoms. Pelvis is stable and the neck and back are stable and nontender. Examination of the left upper chin he shows that she has a splint applied. She has mild edema of the digits. Gross sensation refill is intact. Range of motion is intact. No signs of compartment syndrome present.  Assessment/Plan Left comminuted displaced distal radius fracture Patient Active Problem List   Diagnosis Date Noted  . HTN  (hypertension) 03/06/2012  . Dyslipidemia 03/06/2012  . Upper GIB (gastrointestinal bleeding) 03/06/2012  . Weakness generalized 03/06/2012  . Anemia 03/05/2012  . Peptic ulcer disease 03/05/2012  We are planning surgery for your upper extremity. The risk and benefits of surgery to include risk of bleeding, infection, anesthesia,  damage to normal structures and failure of the surgery to accomplish its intended goals of relieving symptoms and restoring function have been discussed in detail. With this in mind we plan to proceed. I have specifically discussed with the patient the pre-and postoperative regime and the dos and don'ts and risk and benefits in great detail. Risk and benefits of surgery also include risk of dystrophy(CRPS), chronic nerve pain, failure of the healing process to go onto completion and other inherent risks of surgery The relavent the pathophysiology of the disease/injury process, as well as the alternatives for treatment and postoperative course of action has been discussed in great detail with the patient who desires to proceed.  We will do everything in our power to help you (the patient) restore function to the upper extremity. It is a pleasure to see this patient today.   Brianna Camacho L, PA-C 11/23/2017, 7:37 AM

## 2017-11-23 NOTE — Anesthesia Procedure Notes (Signed)
Anesthesia Regional Block: Supraclavicular block   Pre-Anesthetic Checklist: ,, timeout performed, Correct Patient, Correct Site, Correct Laterality, Correct Procedure, Correct Position, site marked, Risks and benefits discussed,  Surgical consent,  Pre-op evaluation,  At surgeon's request and post-op pain management  Laterality: Left  Prep: chloraprep       Needles:  Injection technique: Single-shot  Needle Type: Echogenic Stimulator Needle     Needle Length: 5cm  Needle Gauge: 22     Additional Needles:   Narrative:  Start time: 11/23/2017 7:08 AM End time: 11/23/2017 7:18 AM Injection made incrementally with aspirations every 5 mL.  Performed by: Personally  Anesthesiologist: Duane Boston, MD  Additional Notes: Functioning IV was confirmed and monitors applied.  A 88mm 22ga echogenic arrow stimulator was used. Sterile prep and drape,hand hygiene and sterile gloves were used.Ultrasound guidance: relevant anatomy identified, needle position confirmed, local anesthetic spread visualized around nerve(s)., vascular puncture avoided.  Image printed for medical record.  Negative aspiration and negative test dose prior to incremental administration of local anesthetic. The patient tolerated the procedure well.

## 2017-11-23 NOTE — Transfer of Care (Signed)
Immediate Anesthesia Transfer of Care Note  Patient: Brianna Camacho  Procedure(s) Performed: OPEN REDUCTION INTERNAL FIXATION (ORIF) WRIST FRACTURE (Left Wrist)  Patient Location: PACU  Anesthesia Type:General and Regional  Level of Consciousness: awake, sedated and patient cooperative  Airway & Oxygen Therapy: Patient Spontanous Breathing and Patient connected to nasal cannula oxygen  Post-op Assessment: Report given to RN and Post -op Vital signs reviewed and stable  Post vital signs: Reviewed and stable  Last Vitals:  Vitals:   11/23/17 0627  BP: (!) 148/90  Pulse: 81  Resp: 20  Temp: 36.9 C  SpO2: 98%    Last Pain:  Vitals:   11/23/17 0627  TempSrc: Oral      Patients Stated Pain Goal: 4 (11/94/17 4081)  Complications: No apparent anesthesia complications

## 2017-11-23 NOTE — Anesthesia Procedure Notes (Signed)
Procedure Name: LMA Insertion Date/Time: 11/23/2017 8:10 AM Performed by: Izora Gala, CRNA Pre-anesthesia Checklist: Patient identified, Emergency Drugs available, Suction available and Patient being monitored Patient Re-evaluated:Patient Re-evaluated prior to induction Oxygen Delivery Method: Circle system utilized Preoxygenation: Pre-oxygenation with 100% oxygen Induction Type: IV induction Ventilation: Mask ventilation without difficulty LMA: LMA inserted LMA Size: 3.0 Number of attempts: 1 Placement Confirmation: positive ETCO2 Dental Injury: Teeth and Oropharynx as per pre-operative assessment

## 2017-11-23 NOTE — Anesthesia Postprocedure Evaluation (Signed)
Anesthesia Post Note  Patient: Brianna Camacho  Procedure(s) Performed: OPEN REDUCTION INTERNAL FIXATION (ORIF) WRIST FRACTURE (Left Wrist)     Patient location during evaluation: PACU Anesthesia Type: General Level of consciousness: sedated Pain management: pain level controlled Vital Signs Assessment: post-procedure vital signs reviewed and stable Respiratory status: spontaneous breathing and respiratory function stable Cardiovascular status: stable Postop Assessment: no apparent nausea or vomiting Anesthetic complications: no    Last Vitals:  Vitals:   11/23/17 0627 11/23/17 0920  BP: (!) 148/90 (!) 149/86  Pulse: 81 87  Resp: 20 13  Temp: 36.9 C (!) 36 C  SpO2: 98% 99%    Last Pain:  Vitals:   11/23/17 0920  TempSrc:   PainSc: [place]

## 2017-11-23 NOTE — Progress Notes (Addendum)
Interpreter Siu from [employer] used during preop interview.

## 2017-11-23 NOTE — Discharge Instructions (Signed)

## 2017-11-23 NOTE — Op Note (Signed)
See dictation#[id number] SP ORIF DRF Amedeo Plenty MD

## 2017-11-25 ENCOUNTER — Encounter (HOSPITAL_COMMUNITY): Payer: Self-pay | Admitting: Orthopedic Surgery

## 2017-11-25 NOTE — Op Note (Signed)
NAME:  Brianna Camacho, Brianna Camacho                    ACCOUNT NO.:  MEDICAL RECORD NO.:  [id number]  LOCATION:                                 FACILITY:  PHYSICIAN:  Satira Anis. Loann Chahal, M.D.DATE OF BIRTH:  Jul 17, 1951  DATE OF PROCEDURE: DATE OF DISCHARGE:                              OPERATIVE REPORT   PREOPERATIVE DIAGNOSIS:  Comminuted complex greater than 5-part intra- articular distal radius fracture, left upper extremity.  POSTOPERATIVE DIAGNOSIS:  Comminuted complex greater than 5-part intra- articular distal radius fracture, left upper extremity.  PROCEDURES: 1. Open reduction and internal fixation of greater than 5-part intra-     articular left distal radius fracture with narrow DVR plate and     screw construct from [employer]. 2. Four-view radiographic series, left wrist.  SURGEON:  Satira Anis. Amedeo Plenty, M.D.  ASSISTANT:  Avelina Laine, P.A.-C.  COMPLICATIONS:  None.  TOURNIQUET TIME:  Less than an hour.  DRAINS:  One.  INDICATIONS:  Very pleasant female, presents with above-mentioned diagnosis.  I have counseled her in regard to risks and benefits of surgery including risk of infection, bleeding, anesthesia, damage to normal structures, and failure of surgery to accomplish its intended goals of relieving symptoms and restoring function.  With this in mind, she desires to proceed.  All questions have been encouraged and answered preoperatively.  DESCRIPTION OF PROCEDURE:  The patient was seen by myself and Anesthesia.  She underwent a Hibiclens pre-scrub and a 10-minute surgical Betadine scrub and paint.  Following this, time-out was observed.  Outline marks were made.  Incision was made volar radially. Dissection was carried down under 250 mm of tourniquet control.  The FCR tendon sheath was incised dorsally and palmarly.  Carpal canal contents were retracted ulnarly.  Pronator was incised, and following this, the fracture was reduced.  A standard narrow DVR plate and screw  construct was applied to achieve adequate height, inclination, and volar tilt. This was an ORIF distal radius fracture, greater than 5-part intra- articular.  The ulna was stable.  Distal radioulnar joint, radiocarpal, and midcarpal joints were examined under fluoro and all looked well.  Once again, 4-view radiographic series performed, examined and interpreted by myself, looked excellent in terms of radial height, inclination, and volar tilt.  We irrigated copiously, closed the pronator with 3-0 Vicryl.  TLS drain size 7 placed, followed by skin closure with Prolene. Short arm splint applied.  We will see her back in 2 weeks, sutures out, and a standard DVR protocol be adhered to.  Do's and don'ts have been discussed.  All questions have been encouraged and answered.  It was a pleasure to see her today and participate in her care.     Satira Anis. Amedeo Plenty, M.D.     [facility]  D:  11/23/2017  T:  11/23/2017  Job:  [id number]

## 2018-02-26 ENCOUNTER — Other Ambulatory Visit: Payer: Self-pay | Admitting: Family Medicine

## 2018-02-26 DIAGNOSIS — M810 Age-related osteoporosis without current pathological fracture: Secondary | ICD-10-CM

## 2018-03-20 ENCOUNTER — Other Ambulatory Visit: Payer: Medicare Other

## 2018-04-21 ENCOUNTER — Ambulatory Visit
Admission: RE | Admit: 2018-04-21 | Discharge: 2018-04-21 | Disposition: A | Discharge status: Home / Self Care | Payer: Medicare Other | Source: Ambulatory Visit | Attending: Family Medicine | Admitting: Family Medicine

## 2018-04-21 DIAGNOSIS — M810 Age-related osteoporosis without current pathological fracture: Secondary | ICD-10-CM

## 2018-06-18 NOTE — Congregational Nurse Program (Signed)
Congregational Nurse Program Note  Date of Encounter: 06/18/2018  Past Medical History: Past Medical History:  Diagnosis Date  . Arthritis   . Closed fracture of left distal radius   . Gastritis   . Hyperlipemia   . Hypertension   . Iron deficiency anemia   . UTI (urinary tract infection)     Encounter Details: CN office visit with interpreter [name] assisting.  Patient states she needs appointment for annual eye exam.  Phone call to [facility].  She is scheduled to see Dr. Wynetta Emery on 06/18/2018 at 10:40 am.  States she is feeling well and just returned from visiting friends in [place]. Jake Michaelis RN, Congregational Nurse [phone] CNP Questionnaire - 06/18/18 1932      Questionnaire   Patient Status  Refugee    Race  Asian    Location Patient Served At  Not Applicable    Insurance  Medicaid;Medicare    Uninsured  Not Applicable    Food  No food insecurities    Housing/Utilities  Yes, have permanent housing    Transportation  No transportation needs    Interpersonal Safety  Yes, feel physically and emotionally safe where you currently live    Medication  No medication insecurities    Medical Provider  Yes    Referrals  [facility]    ED Visit Averted  Not Applicable    Life-Saving Intervention Made  Not Applicable

## 2018-07-31 ENCOUNTER — Other Ambulatory Visit: Payer: Self-pay | Admitting: Family Medicine

## 2018-07-31 DIAGNOSIS — Z1231 Encounter for screening mammogram for malignant neoplasm of breast: Secondary | ICD-10-CM

## 2018-09-03 ENCOUNTER — Ambulatory Visit
Admission: RE | Admit: 2018-09-03 | Discharge: 2018-09-03 | Disposition: A | Discharge status: Home / Self Care | Payer: Medicare Other | Source: Ambulatory Visit | Attending: Family Medicine | Admitting: Family Medicine

## 2018-09-03 DIAGNOSIS — Z1231 Encounter for screening mammogram for malignant neoplasm of breast: Secondary | ICD-10-CM

## 2019-02-16 IMAGING — CR DG WRIST COMPLETE 3+V*L*
4 series · 4 of 4 positions shown · non-contrast
Comparison: None in PACs

CLINICAL DATA: Patient fell at home this morning.

EXAM:
LEFT WRIST - COMPLETE 3+ VIEW

[x wrist pa left]
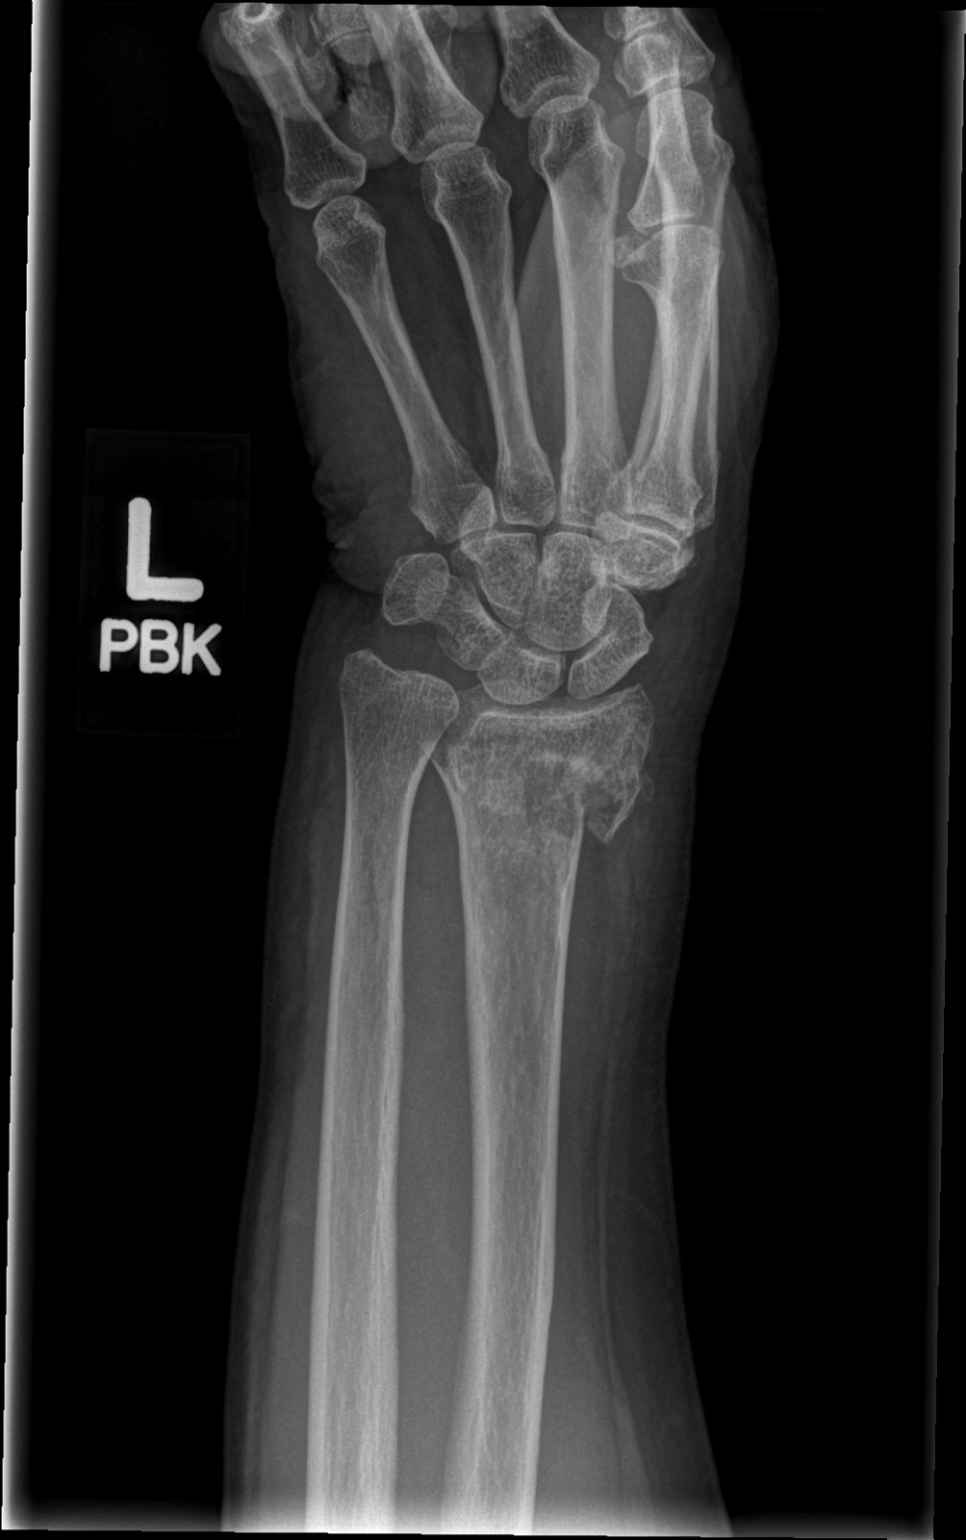

[x wrist obl left]
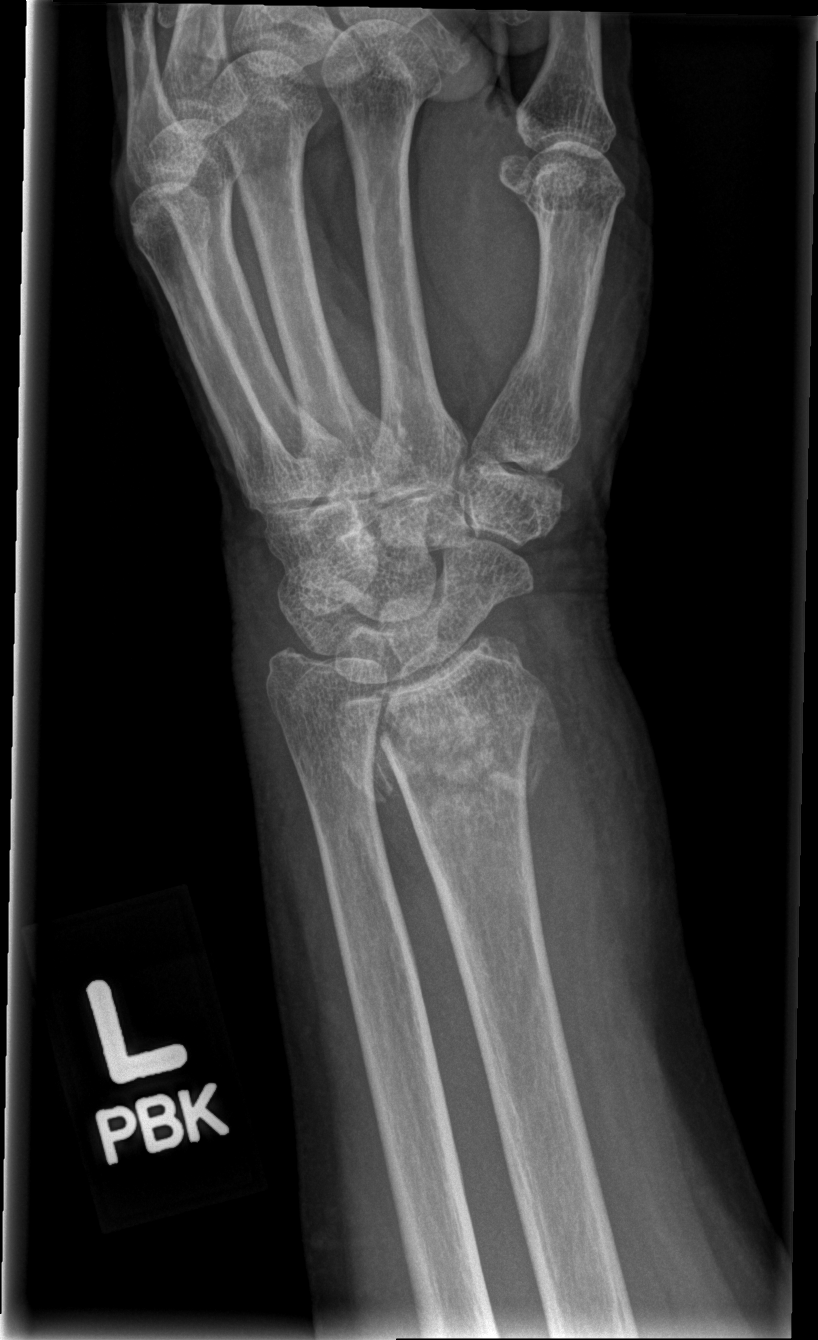

[x wrist lat left]
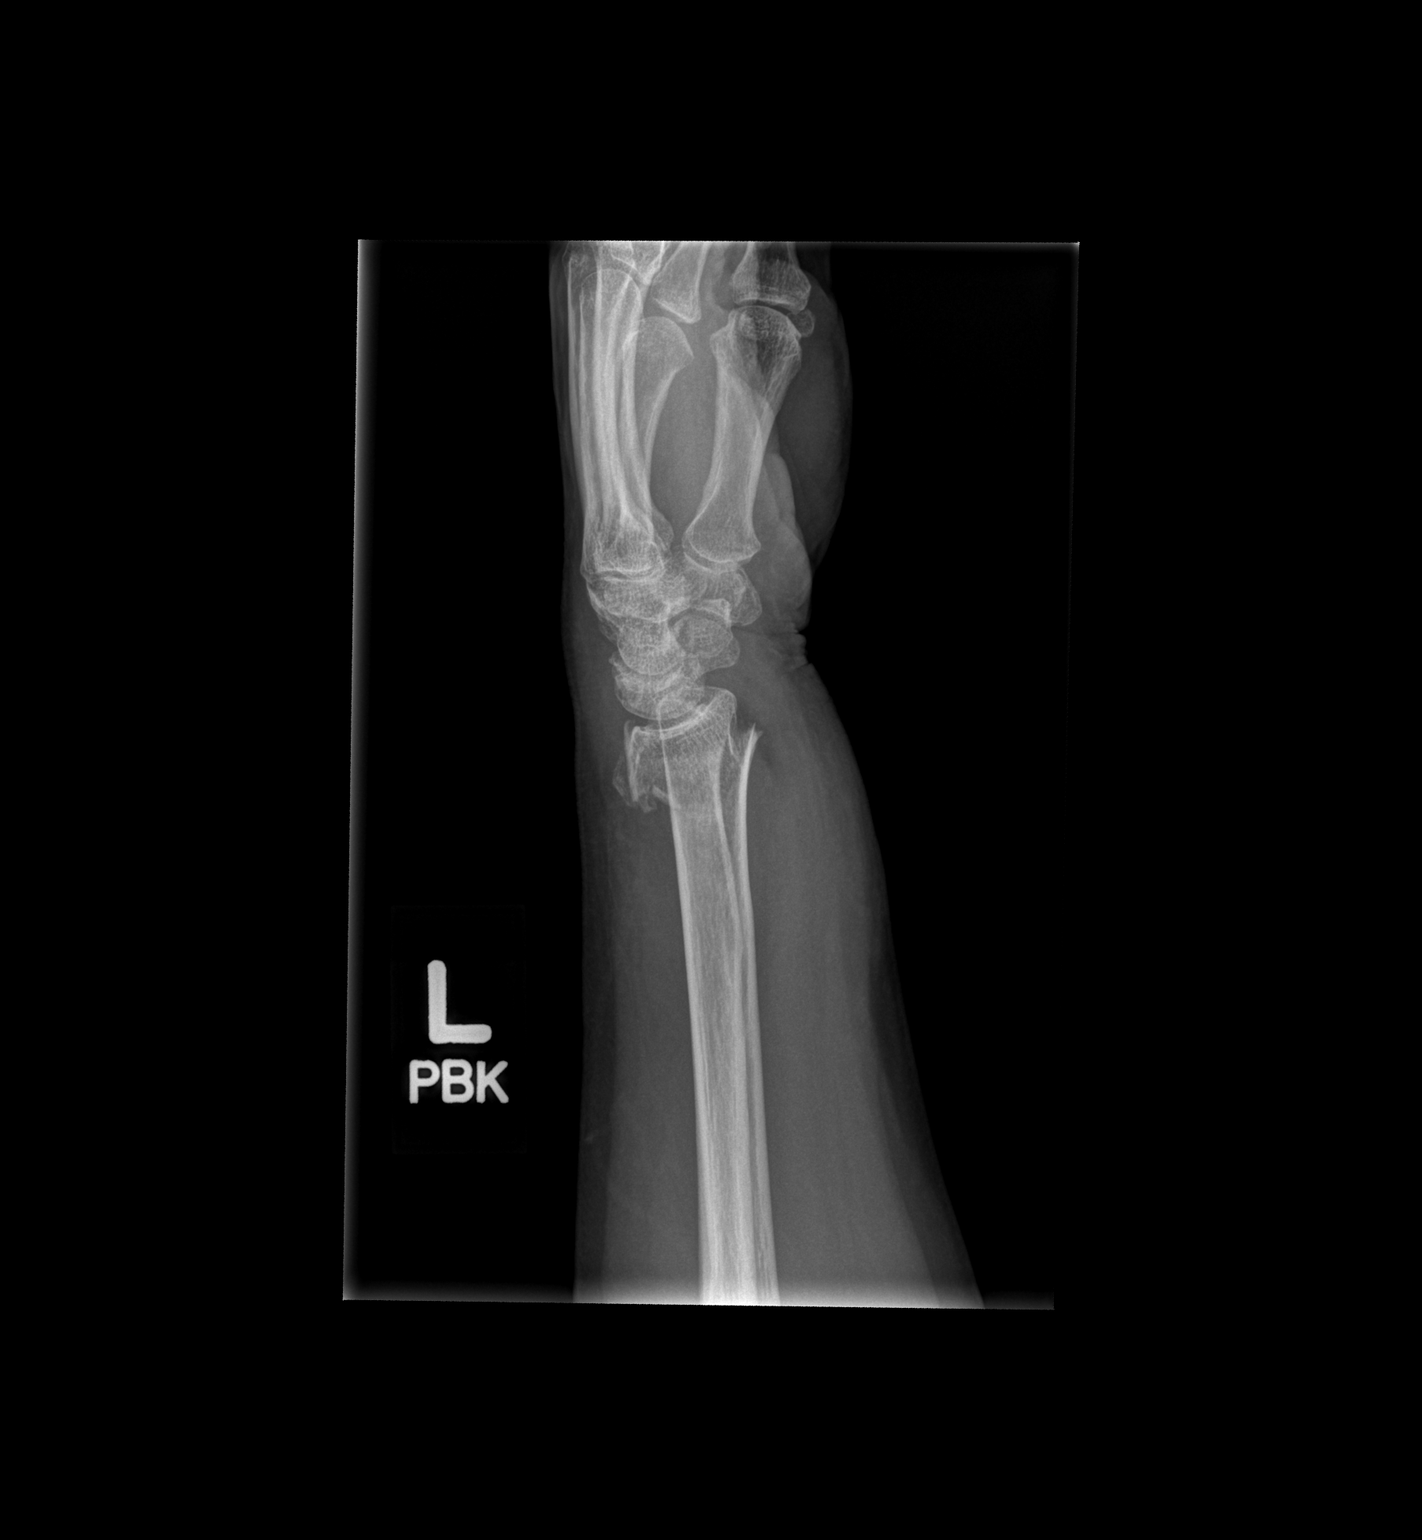

[x wrist navicular view left]
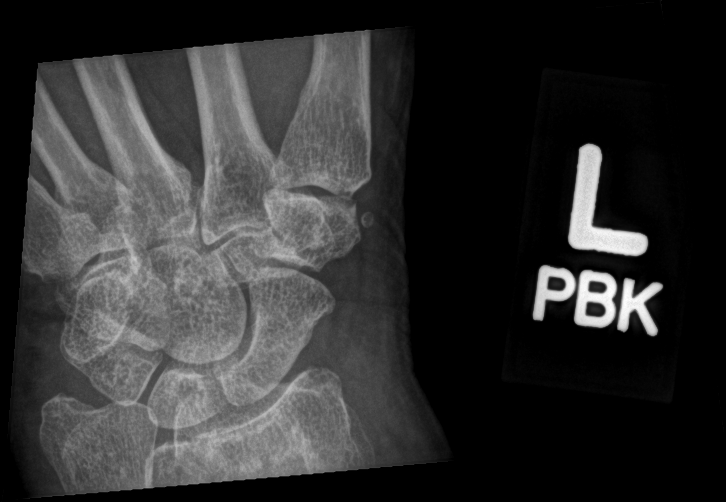

[4 of 4 positions shown; findings below may reference images not displayed]

FINDINGS: The patient has sustained a comminuted impacted fracture of the
distal radial metaphysis which reaches the articular surface. There
is mild dorsal displacement of the distal fracture fragment. The
adjacent ulna is intact. The carpal bones and visualized portions of
the metacarpals are intact. There is mild diffuse soft tissue
swelling.
IMPRESSION: There is an acute impacted, comminuted, intra-articular, angulated,
and mildly displaced fracture of the distal left radial metaphysis.

## 2019-07-29 ENCOUNTER — Other Ambulatory Visit: Payer: Self-pay | Admitting: Family Medicine

## 2019-07-29 DIAGNOSIS — Z1231 Encounter for screening mammogram for malignant neoplasm of breast: Secondary | ICD-10-CM

## 2019-07-29 NOTE — Congregational Nurse Program (Signed)
CN office visit with interpreter [name] assisting.  Patient needed help scheduling Mammogram appointment.  Phone call to the [facility] and made appointment for September 11, 2019 at 12:10 am.  We also called to make an eye doctor appointment and she was put on a wait list at [facility] on [address].  They will call interpreter Diu when anm appointment is available.  We also interpreted a letter from her PCP Dr. Nancy Fetter regarding her lab results from last PCP visit.  Jake Michaelis RN, Congregational Nurse [phone]

## 2019-09-09 NOTE — Congregational Nurse Program (Signed)
CN office visit with interpreter [name] assisting.  Patient received letter from PCP Dr. Nancy Fetter but could not read it.  We explained that it was informing her about a new service at her doctor's office.  They now have a pharmacist to assess her medications and provide information and recommendations.  She already has an appointment scheduled with Dr. Nancy Fetter on October 5 so we called and requested she see the pharmacist that same day following her MD visit.  Jake Michaelis RN, Congregational Nurse [phone]

## 2019-09-11 ENCOUNTER — Ambulatory Visit
Admission: RE | Admit: 2019-09-11 | Discharge: 2019-09-11 | Disposition: A | Discharge status: Home / Self Care | Payer: Medicare Other | Source: Ambulatory Visit | Attending: Family Medicine | Admitting: Family Medicine

## 2019-09-11 ENCOUNTER — Other Ambulatory Visit: Payer: Self-pay

## 2019-09-11 DIAGNOSIS — Z1231 Encounter for screening mammogram for malignant neoplasm of breast: Secondary | ICD-10-CM

## 2019-09-16 NOTE — Congregational Nurse Program (Signed)
CN office visit with interpreter [name].  Patient was assisted by [employer] intern Blair Hailey who helped her complete her food stamp renewal application.Jake Michaelis RN, Congregational Nurse [phone]

## 2020-03-09 NOTE — Congregational Nurse Program (Signed)
CN office visit with interpreter [name] assisting.  Due to language barrier patient needed help obtaining medication refills.  CN called her pharmacy regarding refills needed and they will contact Dr. Lynnda Child office.  Patient states she has an appointment scheduled for April 01, 2020.  Jake Michaelis RN, Congregational Nurse [phone]

## 2020-03-30 NOTE — Congregational Nurse Program (Signed)
CN office visit with interpreter [name] assisting.  Patient complaining of Rt. Knee pain x 2 weeks.  States it is worse at night and keeps her awake.  She rates it a 7 on pain scale.  Has been seen in the past at [facility] for broken hand.  CN called there and scheduled appointmentt with Dr. Gladstone Lighter for Monday April 04, 2020 @ 9:30 am. Jake Michaelis RN, Congregational Nurse [phone]

## 2020-04-04 ENCOUNTER — Other Ambulatory Visit: Payer: Self-pay | Admitting: Family Medicine

## 2020-04-04 DIAGNOSIS — M810 Age-related osteoporosis without current pathological fracture: Secondary | ICD-10-CM

## 2020-06-08 NOTE — Congregational Nurse Program (Signed)
CN office visit with interpreter [name] assisting. Patient requested help getting Lisinopril refilled.  Phone call to [facility] on [facility].  They will contact Dr. Nancy Fetter and notify patient when prescription is ready.  Jake Michaelis RN, Congregational Nurse [phone]

## 2020-08-03 ENCOUNTER — Other Ambulatory Visit: Payer: Self-pay | Admitting: Family Medicine

## 2020-08-03 DIAGNOSIS — Z1231 Encounter for screening mammogram for malignant neoplasm of breast: Secondary | ICD-10-CM

## 2020-08-03 NOTE — Congregational Nurse Program (Signed)
CN office visit with interpreter [name] assisting.  Patient requesting assistance in scheduling mammogram.  Phone call to [facility] and made appointment on 09/13/2020 at 9:30 am.  Jake Michaelis RN, Congregational Nurse [phone]

## 2020-09-13 ENCOUNTER — Other Ambulatory Visit: Payer: Self-pay

## 2020-09-13 ENCOUNTER — Ambulatory Visit
Admission: RE | Admit: 2020-09-13 | Discharge: 2020-09-13 | Disposition: A | Discharge status: Home / Self Care | Payer: Medicare Other | Source: Ambulatory Visit | Attending: Family Medicine | Admitting: Family Medicine

## 2020-09-13 DIAGNOSIS — Z1231 Encounter for screening mammogram for malignant neoplasm of breast: Secondary | ICD-10-CM

## 2020-09-14 NOTE — Congregational Nurse Program (Signed)
CN office visit with Interpreter [name] assisting.  Patient needed help calling pharmacy to request refills for medication.  She also needed to complete her Medicaid recertification and was helped by [facility] intern March Rivera.  Ellwood Sayers, Congregational Nurse [phone]

## 2020-11-03 ENCOUNTER — Other Ambulatory Visit: Payer: Self-pay

## 2020-11-03 ENCOUNTER — Emergency Department (HOSPITAL_COMMUNITY)
Admission: EM | Admit: 2020-11-03 | Discharge: 2020-11-03 | Disposition: A | Discharge status: Home / Self Care | Payer: Medicare Other | Attending: Emergency Medicine | Admitting: Emergency Medicine

## 2020-11-03 DIAGNOSIS — M79603 Pain in arm, unspecified: Secondary | ICD-10-CM | POA: Diagnosis present

## 2020-11-03 DIAGNOSIS — I10 Essential (primary) hypertension: Secondary | ICD-10-CM | POA: Insufficient documentation

## 2020-11-03 DIAGNOSIS — M799 Soft tissue disorder, unspecified: Secondary | ICD-10-CM | POA: Insufficient documentation

## 2020-11-03 DIAGNOSIS — Z79899 Other long term (current) drug therapy: Secondary | ICD-10-CM | POA: Diagnosis not present

## 2020-11-03 DIAGNOSIS — M7989 Other specified soft tissue disorders: Secondary | ICD-10-CM

## 2020-11-03 NOTE — ED Triage Notes (Signed)
Pt arrives via EMS from home with complaints of arm pain d/t resent Covid booster shot on 10/31/20  Swelling noted on shoulder and upper arm.  140/90 HR 88 RR 18 99.9 T 100% RA

## 2020-11-03 NOTE — ED Notes (Signed)
Pt provided ice pack 

## 2020-11-03 NOTE — ED Provider Notes (Signed)
[facility] EMERGENCY DEPARTMENT Provider Note   CSN: [id number] Arrival date & time: 11/03/20  0017     History Chief Complaint  Patient presents with   Arm Pain    Brianna Camacho is a 69 y.o. female.  HPI    69 year old comes in a chief complaint of arm pain.  She has history of hypertension and hyperlipidemia.  4 days ago she received her COVID-19 booster.  She has noticed some swelling and discomfort over the left lateral neck since then.  Patient reports that she is having burning type pain in that area.  She has been applying topical ointment without significant relief.  Review of systems negative for any fevers, chills, swelling, numbness, tingling.  She does not think she strained her neck.  Patient has no history of cancer, diabetes and she is immunocompetent.  No history of DVT, PE and no risk factors for same.  Translation service was utilized for this visit.  Past Medical History:  Diagnosis Date   Arthritis    Closed fracture of left distal radius    Gastritis    Hyperlipemia    Hypertension    Iron deficiency anemia    UTI (urinary tract infection)     Patient Active Problem List   Diagnosis Date Noted   HTN (hypertension) 03/06/2012   Dyslipidemia 03/06/2012   Upper GIB (gastrointestinal bleeding) 03/06/2012   Weakness generalized 03/06/2012   Anemia 03/05/2012   Peptic ulcer disease 03/05/2012    Past Surgical History:  Procedure Laterality Date   CESAREAN SECTION     ESOPHAGOGASTRODUODENOSCOPY  03/06/2012   Procedure: ESOPHAGOGASTRODUODENOSCOPY (EGD);  Surgeon: Beryle Beams, MD;  Location: [facility] ENDOSCOPY;  Service: Endoscopy;  Laterality: N/A;   HAND SURGERY     HOT HEMOSTASIS  03/06/2012   Procedure: HOT HEMOSTASIS (ARGON PLASMA COAGULATION/BICAP);  Surgeon: Beryle Beams, MD;  Location: [facility] ENDOSCOPY;  Service: Endoscopy;;   ORIF WRIST FRACTURE Left 11/23/2017   Procedure: OPEN REDUCTION INTERNAL FIXATION  (ORIF) WRIST FRACTURE;  Surgeon: Roseanne Kaufman, MD;  Location: [place];  Service: Orthopedics;  Laterality: Left;     OB History   No obstetric history on file.     No family history on file.  Social History   Tobacco Use   Smoking status: Never Smoker   Smokeless tobacco: Never Used  Vaping Use   Vaping Use: Never used  Substance Use Topics   Alcohol use: No   Drug use: No    Home Medications Prior to Admission medications   Medication Sig Start Date End Date Taking? Authorizing Provider  acetaminophen (TYLENOL) 500 MG tablet Take 500 mg by mouth daily as needed for mild pain.    [provider]  esomeprazole (NEXIUM) 40 MG capsule Take 40 mg by mouth daily before breakfast.    [provider]  guaiFENesin (ROBITUSSIN) 100 MG/5ML liquid Take 5-10 mLs (100-200 mg total) by mouth every 4 (four) hours as needed for cough. Patient not taking: Reported on 11/19/2017 02/28/14   Domenic Moras, PA-C  HYDROcodone-acetaminophen (NORCO/VICODIN) 5-325 MG tablet Take 1 tablet by mouth every 4 (four) hours as needed. 11/19/17   Dorie Rank, MD  Liniments [facility] PAIN RELIEF PATCH EX) Apply 1 patch topically daily as needed (pain).    [provider]  lisinopril (PRINIVIL,ZESTRIL) 40 MG tablet Take 40 mg by mouth daily. 08/21/17   [provider]  phenazopyridine (PYRIDIUM) 95 MG tablet Take 1 tablet (95 mg total) by mouth  3 (three) times daily as needed for pain. Patient not taking: Reported on 11/19/2017 11/01/17   Rodell Perna A, PA-C  rosuvastatin (CRESTOR) 20 MG tablet Take 20 mg by mouth daily.    [provider]    Allergies    Patient has no known allergies.  Review of Systems   Review of Systems  Constitutional: Negative for activity change.  Respiratory: Negative for shortness of breath.   Cardiovascular: Negative for chest pain.  Gastrointestinal: Negative for nausea and vomiting.  Musculoskeletal: Positive for myalgias.    Allergic/Immunologic: Negative for immunocompromised state.    Physical Exam Updated Vital Signs BP (!) 142/82 (BP Location: Right Arm)    Pulse 96    Temp 98.5 F (36.9 C) (Oral)    Resp 16    Ht 5' (1.524 m)    Wt 54.4 kg    SpO2 100%    BMI 23.44 kg/m   Physical Exam Vitals and nursing note reviewed.  Constitutional:      Appearance: She is well-developed.  HENT:     Head: Normocephalic and atraumatic.  Cardiovascular:     Rate and Rhythm: Normal rate.  Pulmonary:     Effort: Pulmonary effort is normal.  Abdominal:     General: Bowel sounds are normal.  Musculoskeletal:     Cervical back: Normal range of motion and neck supple.     Comments: Mobile lesion, soft, 1 cm over the L lateral neck  Skin:    General: Skin is warm and dry.  Neurological:     Mental Status: She is alert and oriented to person, place, and time.     ED Results / Procedures / Treatments   Labs (all labs ordered are listed, but only abnormal results are displayed) Labs Reviewed - No data to display  EKG None  Radiology No results found.  Procedures Procedures (including critical care time)  Medications Ordered in ED Medications - No data to display  ED Course  I have reviewed the triage vital signs and the nursing notes.  Pertinent labs & imaging results that were available during my care of the patient were reviewed by me and considered in my medical decision making (see chart for details).    MDM Rules/Calculators/A&P                          69 year old comes in a chief complaint of shoulder pain.  She is noted to have a cystlike lesion over her lateral neck.  I suspect it is a ganglion cyst.  There is no signs of infection.  No evidence of radiculopathy, neuropathy and no swelling to the face or arm.  Were not clinically concerned for DVT or underlying infection.  Plan is to be conservative with RICE treatment and follow-up with PCP in 1 month.  She will return to the ER if she  starts developing worsening symptoms.  Final Clinical Impression(s) / ED Diagnoses Final diagnoses:  Cyst of soft tissue    Rx / DC Orders ED Discharge Orders    None       Varney Biles, MD 11/03/20 1109

## 2020-11-03 NOTE — Discharge Instructions (Addendum)
You have a small cyst in your left shoulder. We recommend that you ice the area and take 400 mg motrin every 6 hours for the next 2-3 days. Do not massage the area, as it might cause more irritation. See your primary care doctor in 1 month for reassessment.  Return to the ER if there is increased swelling, redness, pain, new numbness or weakness.

## 2021-02-22 NOTE — Congregational Nurse Program (Signed)
CN office visit with interpreter [name] assisting.  Patient requested help in getting a refill for her Lisinopril.  [place] and they will have refill ready in 1 hour.  Jake Michaelis RN, Congregational Nurse [phone]

## 2021-03-29 NOTE — Congregational Nurse Program (Signed)
CN office visit with interpreter [name] assisting.  Helped patient complete paperwork for wellness exam with Dr. Nancy Fetter on 04/14/2021.  Jake Michaelis RN, Congregational Nurse [phone]

## 2021-04-14 DIAGNOSIS — R8290 Unspecified abnormal findings in urine: Secondary | ICD-10-CM | POA: Diagnosis not present

## 2021-04-14 DIAGNOSIS — E782 Mixed hyperlipidemia: Secondary | ICD-10-CM | POA: Diagnosis not present

## 2021-04-14 DIAGNOSIS — Z1389 Encounter for screening for other disorder: Secondary | ICD-10-CM | POA: Diagnosis not present

## 2021-04-14 DIAGNOSIS — Z23 Encounter for immunization: Secondary | ICD-10-CM | POA: Diagnosis not present

## 2021-04-14 DIAGNOSIS — I129 Hypertensive chronic kidney disease with stage 1 through stage 4 chronic kidney disease, or unspecified chronic kidney disease: Secondary | ICD-10-CM | POA: Diagnosis not present

## 2021-04-14 DIAGNOSIS — M810 Age-related osteoporosis without current pathological fracture: Secondary | ICD-10-CM | POA: Diagnosis not present

## 2021-04-14 DIAGNOSIS — N1831 Chronic kidney disease, stage 3a: Secondary | ICD-10-CM | POA: Diagnosis not present

## 2021-04-14 DIAGNOSIS — Z1159 Encounter for screening for other viral diseases: Secondary | ICD-10-CM | POA: Diagnosis not present

## 2021-04-14 DIAGNOSIS — D473 Essential (hemorrhagic) thrombocythemia: Secondary | ICD-10-CM | POA: Diagnosis not present

## 2021-04-14 DIAGNOSIS — K219 Gastro-esophageal reflux disease without esophagitis: Secondary | ICD-10-CM | POA: Diagnosis not present

## 2021-04-14 DIAGNOSIS — Z0000 Encounter for general adult medical examination without abnormal findings: Secondary | ICD-10-CM | POA: Diagnosis not present

## 2021-04-19 NOTE — Congregational Nurse Program (Signed)
CN office visit with interpreter [name] assisting.  Patient requested help scheduling eye doctor appointment.  Phone call to [facility] on [place]  Appointment made for 05/16/2021 at 9:00 am. Diu will accompany her to appointment to provide interpretation.Requested that results of visit be sent to her PCP Dr. Nancy Fetter at [facility] at [place].  Jake Michaelis RN, Congregational Nurse [phone]

## 2021-05-03 NOTE — Congregational Nurse Program (Signed)
CN office visit wit interpreter [name] assisting.  Patient states she would like to make follow-up appointment with Dr. Gladstone Lighter regarding knee and back pain.  Appointment scheduled for 05/16/2021 at 2:30 pm.  Jake Michaelis RN, Congregational Nurse [phone]

## 2021-05-16 DIAGNOSIS — M25561 Pain in right knee: Secondary | ICD-10-CM | POA: Diagnosis not present

## 2021-05-16 DIAGNOSIS — M25562 Pain in left knee: Secondary | ICD-10-CM | POA: Diagnosis not present

## 2021-05-16 DIAGNOSIS — M5459 Other low back pain: Secondary | ICD-10-CM | POA: Diagnosis not present

## 2021-06-07 ENCOUNTER — Other Ambulatory Visit: Payer: Self-pay | Admitting: Family Medicine

## 2021-06-07 DIAGNOSIS — M810 Age-related osteoporosis without current pathological fracture: Secondary | ICD-10-CM

## 2021-06-14 NOTE — Congregational Nurse Program (Signed)
CN office visit.  Patient received letter from PCP Dr. Nancy Fetter at [facility] stating she needed to schedule appointment for bone density test.  Phone call to [facility] and made appointment for 11/21/2021 at 9:30 am.  Jake Michaelis RN, Congregational Nurse [phone]

## 2021-08-09 ENCOUNTER — Other Ambulatory Visit: Payer: Self-pay | Admitting: Family Medicine

## 2021-08-09 DIAGNOSIS — Z1231 Encounter for screening mammogram for malignant neoplasm of breast: Secondary | ICD-10-CM

## 2021-08-09 NOTE — Congregational Nurse Program (Signed)
CN office visit with interpreter [name] interpreting.  Patient received letter stating she needs to schedule appointment for annual mammogram and she needed help doing this.  Phone call to the [facility] and made appointment for 09/19/21 at 8:50 am.  Instructed her to wear no deodorant, lotions, or perfume.  Jake Michaelis RN, Congregational Nurse [phone]

## 2021-09-13 NOTE — Congregational Nurse Program (Signed)
CN office visit with interpreter [name] assisting.  Helped patient complete her food stamp renewal application.  Jake Michaelis RN, Congregational Nurse [phone]

## 2021-09-19 ENCOUNTER — Other Ambulatory Visit: Payer: Self-pay

## 2021-09-19 ENCOUNTER — Ambulatory Visit
Admission: RE | Admit: 2021-09-19 | Discharge: 2021-09-19 | Disposition: A | Discharge status: Home / Self Care | Payer: Medicare Other | Source: Ambulatory Visit | Attending: Family Medicine | Admitting: Family Medicine

## 2021-09-19 DIAGNOSIS — Z1231 Encounter for screening mammogram for malignant neoplasm of breast: Secondary | ICD-10-CM | POA: Diagnosis not present

## 2021-10-17 DIAGNOSIS — I129 Hypertensive chronic kidney disease with stage 1 through stage 4 chronic kidney disease, or unspecified chronic kidney disease: Secondary | ICD-10-CM | POA: Diagnosis not present

## 2021-10-17 DIAGNOSIS — E782 Mixed hyperlipidemia: Secondary | ICD-10-CM | POA: Diagnosis not present

## 2021-10-17 DIAGNOSIS — Z23 Encounter for immunization: Secondary | ICD-10-CM | POA: Diagnosis not present

## 2021-10-17 DIAGNOSIS — K219 Gastro-esophageal reflux disease without esophagitis: Secondary | ICD-10-CM | POA: Diagnosis not present

## 2021-10-17 DIAGNOSIS — N1831 Chronic kidney disease, stage 3a: Secondary | ICD-10-CM | POA: Diagnosis not present

## 2021-11-21 ENCOUNTER — Ambulatory Visit
Admission: RE | Admit: 2021-11-21 | Discharge: 2021-11-21 | Disposition: A | Discharge status: Home / Self Care | Payer: Medicare Other | Source: Ambulatory Visit | Attending: Family Medicine | Admitting: Family Medicine

## 2021-11-21 ENCOUNTER — Other Ambulatory Visit: Payer: Self-pay

## 2021-11-21 DIAGNOSIS — M8589 Other specified disorders of bone density and structure, multiple sites: Secondary | ICD-10-CM | POA: Diagnosis not present

## 2021-11-21 DIAGNOSIS — Z780 Asymptomatic menopausal state: Secondary | ICD-10-CM | POA: Diagnosis not present

## 2021-11-21 DIAGNOSIS — M810 Age-related osteoporosis without current pathological fracture: Secondary | ICD-10-CM

## 2022-02-21 DIAGNOSIS — K219 Gastro-esophageal reflux disease without esophagitis: Secondary | ICD-10-CM | POA: Diagnosis not present

## 2022-02-21 DIAGNOSIS — I129 Hypertensive chronic kidney disease with stage 1 through stage 4 chronic kidney disease, or unspecified chronic kidney disease: Secondary | ICD-10-CM | POA: Diagnosis not present

## 2022-02-21 DIAGNOSIS — N1831 Chronic kidney disease, stage 3a: Secondary | ICD-10-CM | POA: Diagnosis not present

## 2022-02-21 NOTE — Congregational Nurse Program (Signed)
CN office visit with interpreter [name] assisting.  Patient was seen at a health screening clinic on 01/26/2022 at [facility] provided by [place]. Readings on that date were 166/94 and 150/97.  Follow-up BP check today was 155/90.  States she took her medicine about 1 hour ago.  Referred to PCP Dr. Matilde Haymaker.  Initially patient refused to go wanting to wait until an appointment already scheduled for May 2023.  After educating her about potential effects and risks she agreed to go and we were able to schedule appointment for today at 4:30 pm.  Jake Michaelis RN, Congregational Nurse [phone] ?

## 2022-02-28 NOTE — Congregational Nurse Program (Signed)
CN office visit with interpreter [name] assisting. Patient states for the past week her heart has felt like it was pounding or beating very hard.  No chest pain or SOB.  Pulse 88 and regular.  BP 150/70.  States she did not take new BP medication this morning before driving because it makes her dizzy. Patient requesting help making appointment with Dr. Nancy Fetter: scheduled for 03/05/2022. Jake Michaelis RN, Congregational Nurse [phone]. ?

## 2022-03-02 DIAGNOSIS — S161XXA Strain of muscle, fascia and tendon at neck level, initial encounter: Secondary | ICD-10-CM | POA: Diagnosis not present

## 2022-03-02 DIAGNOSIS — R002 Palpitations: Secondary | ICD-10-CM | POA: Diagnosis not present

## 2022-03-02 DIAGNOSIS — N1831 Chronic kidney disease, stage 3a: Secondary | ICD-10-CM | POA: Diagnosis not present

## 2022-03-02 DIAGNOSIS — I129 Hypertensive chronic kidney disease with stage 1 through stage 4 chronic kidney disease, or unspecified chronic kidney disease: Secondary | ICD-10-CM | POA: Diagnosis not present

## 2022-03-07 NOTE — Congregational Nurse Program (Signed)
CN office visit with interpreter [name] assisting.  Patient asked to have her BP machine checked for accuracy with my manual cuff. Patient's reading was 103/64. CN reading 100/64.  Advised patient her machine seems to be working well.  States she is taking new BP medicine (Lisinopril 40 mg) daily.  Denies dizziness or feeling lightheaded. Will continue monitoring BP daily.  She states that Dr. Nancy Fetter told her she could cut Lisinopril in half if BP goes too low or she develops symptoms of hypotension.  She will notify doctor's office with concerns.  Jake Michaelis RN, Congregational Nurse [phone]. ?

## 2022-04-04 NOTE — Congregational Nurse Program (Signed)
CN office visit with interpreter [name] assisting.  Patient needed help completing paperwork for her annual wellness visit with Dr. Nancy Fetter at [place] at [place]. Jake Michaelis RN, Congregational Nurse [phone] ?

## 2022-04-17 DIAGNOSIS — Z0000 Encounter for general adult medical examination without abnormal findings: Secondary | ICD-10-CM | POA: Diagnosis not present

## 2022-04-17 DIAGNOSIS — K219 Gastro-esophageal reflux disease without esophagitis: Secondary | ICD-10-CM | POA: Diagnosis not present

## 2022-04-17 DIAGNOSIS — N1831 Chronic kidney disease, stage 3a: Secondary | ICD-10-CM | POA: Diagnosis not present

## 2022-04-17 DIAGNOSIS — Z23 Encounter for immunization: Secondary | ICD-10-CM | POA: Diagnosis not present

## 2022-04-17 DIAGNOSIS — I129 Hypertensive chronic kidney disease with stage 1 through stage 4 chronic kidney disease, or unspecified chronic kidney disease: Secondary | ICD-10-CM | POA: Diagnosis not present

## 2022-04-17 DIAGNOSIS — M810 Age-related osteoporosis without current pathological fracture: Secondary | ICD-10-CM | POA: Diagnosis not present

## 2022-04-17 DIAGNOSIS — D473 Essential (hemorrhagic) thrombocythemia: Secondary | ICD-10-CM | POA: Diagnosis not present

## 2022-04-17 DIAGNOSIS — E782 Mixed hyperlipidemia: Secondary | ICD-10-CM | POA: Diagnosis not present

## 2022-07-18 NOTE — Congregational Nurse Program (Signed)
CN office visit with interpreter [name] assisting.  Patient brought a letter from [facility] regarding their policy of filing claims against her estate to recoup money for medical bills they paid.  We interpreted and explained meaning of letter.  She also requested help scheduling appointment for eye exam. Scheduled with [facility] in [facility] on 08/14/2022 @ 9:00 am.  Interpreter Diu Bernadene Bell will accompany her to appointment.  Jake Michaelis RN, Congregational Nurse [phone]

## 2022-08-15 ENCOUNTER — Other Ambulatory Visit: Payer: Self-pay | Admitting: Family Medicine

## 2022-08-15 DIAGNOSIS — Z1231 Encounter for screening mammogram for malignant neoplasm of breast: Secondary | ICD-10-CM

## 2022-08-15 NOTE — Congregational Nurse Program (Signed)
CN office visit with interpreter [name] assisting.  Patient requesting help to schedule annual mammogram.  Phone call to [facility].  Appointment scheduled for 10/01/2022 @ 9:30 am.  Jake Michaelis RN, Congregational Nurse [phone]

## 2022-10-01 ENCOUNTER — Ambulatory Visit
Admission: RE | Admit: 2022-10-01 | Discharge: 2022-10-01 | Disposition: A | Discharge status: Home / Self Care | Payer: Medicare Other | Source: Ambulatory Visit | Attending: Family Medicine | Admitting: Family Medicine

## 2022-10-01 DIAGNOSIS — Z1231 Encounter for screening mammogram for malignant neoplasm of breast: Secondary | ICD-10-CM

## 2022-10-19 DIAGNOSIS — N1831 Chronic kidney disease, stage 3a: Secondary | ICD-10-CM | POA: Diagnosis not present

## 2022-10-19 DIAGNOSIS — I129 Hypertensive chronic kidney disease with stage 1 through stage 4 chronic kidney disease, or unspecified chronic kidney disease: Secondary | ICD-10-CM | POA: Diagnosis not present

## 2022-10-19 DIAGNOSIS — Z23 Encounter for immunization: Secondary | ICD-10-CM | POA: Diagnosis not present

## 2022-10-19 DIAGNOSIS — D509 Iron deficiency anemia, unspecified: Secondary | ICD-10-CM | POA: Diagnosis not present

## 2022-10-19 DIAGNOSIS — K219 Gastro-esophageal reflux disease without esophagitis: Secondary | ICD-10-CM | POA: Diagnosis not present

## 2022-10-19 DIAGNOSIS — E782 Mixed hyperlipidemia: Secondary | ICD-10-CM | POA: Diagnosis not present

## 2023-03-20 NOTE — Congregational Nurse Program (Signed)
CN office visit with interpreter Truddie Crumble and Med Student Cecilie Kicks.  Patient presented requesting assistance in making appointment with an orthopedist due to bilateral pain in upper back for approximately 3 weeks. Med student examined and since problem seemed more muscular patient was referred to PCP for further evaluation.  Appointment scheduled with Dr. Linton Rump at [facility] on 03/22/2023 @ 10:30 am.  Brantley Fling RN, Congregational Nurse [phone]

## 2023-03-22 DIAGNOSIS — M549 Dorsalgia, unspecified: Secondary | ICD-10-CM | POA: Diagnosis not present

## 2023-05-01 NOTE — Congregational Nurse Program (Signed)
CN office visit with interpreter [name] assisting.  Helped patient complete paperwork for 05/13/2023 appointment with Dr. Wynelle Link.  States she is taking medication as ordered.  Checks BP at home and it is normal.  Complains of mild lower back pain and shoulder pain which are relieved by Tylenol.  Brantley Fling RN, Congregational Nurse [phone]

## 2023-05-13 DIAGNOSIS — N1832 Chronic kidney disease, stage 3b: Secondary | ICD-10-CM | POA: Diagnosis not present

## 2023-05-13 DIAGNOSIS — M810 Age-related osteoporosis without current pathological fracture: Secondary | ICD-10-CM | POA: Diagnosis not present

## 2023-05-13 DIAGNOSIS — I129 Hypertensive chronic kidney disease with stage 1 through stage 4 chronic kidney disease, or unspecified chronic kidney disease: Secondary | ICD-10-CM | POA: Diagnosis not present

## 2023-05-13 DIAGNOSIS — K219 Gastro-esophageal reflux disease without esophagitis: Secondary | ICD-10-CM | POA: Diagnosis not present

## 2023-05-13 DIAGNOSIS — E782 Mixed hyperlipidemia: Secondary | ICD-10-CM | POA: Diagnosis not present

## 2023-05-13 DIAGNOSIS — D473 Essential (hemorrhagic) thrombocythemia: Secondary | ICD-10-CM | POA: Diagnosis not present

## 2023-05-13 DIAGNOSIS — Z0000 Encounter for general adult medical examination without abnormal findings: Secondary | ICD-10-CM | POA: Diagnosis not present

## 2023-05-22 NOTE — Congregational Nurse Program (Signed)
CN office visit with interpreter Truddie Crumble and nursing student [facility] assisting.  Patient brought a copy of her labwork from Dr. Wynelle Link.  Sky went over the results (which were all WNL) with patient.  Brantley Fling RN, Congregational Nurse [phone].

## 2023-06-19 NOTE — Congregational Nurse Program (Signed)
CN office visit with interpreter [name] assisting.  Patient received a copy of lab results from visit to PCP Dr. Linton Rump at [facility].  She requested help understanding the results.  We interpreted and explained for her.  She asked how she could improve her kidney function and we recommended she drink more water. Brantley Fling RN, Congregational Nurse [phone]

## 2023-08-28 ENCOUNTER — Other Ambulatory Visit: Payer: Self-pay | Admitting: Family Medicine

## 2023-08-28 DIAGNOSIS — Z0000 Encounter for general adult medical examination without abnormal findings: Secondary | ICD-10-CM

## 2023-08-28 NOTE — Congregational Nurse Program (Signed)
CN office visit with interpreter [name] assisting.  Patient requested assistance in scheduling appointment for mammogram.  Appointment made for 10/03/2023 @ 9;30 am with [facility].  Brantley Fling RN, Congregational Nurse [phone]

## 2023-10-03 ENCOUNTER — Ambulatory Visit
Admission: RE | Admit: 2023-10-03 | Discharge: 2023-10-03 | Disposition: A | Discharge status: Home / Self Care | Payer: 59 | Source: Ambulatory Visit | Attending: Family Medicine | Admitting: Family Medicine

## 2023-10-03 DIAGNOSIS — Z0000 Encounter for general adult medical examination without abnormal findings: Secondary | ICD-10-CM

## 2023-10-03 DIAGNOSIS — Z1231 Encounter for screening mammogram for malignant neoplasm of breast: Secondary | ICD-10-CM | POA: Diagnosis not present

## 2023-11-12 DIAGNOSIS — Z23 Encounter for immunization: Secondary | ICD-10-CM | POA: Diagnosis not present

## 2023-11-12 DIAGNOSIS — D509 Iron deficiency anemia, unspecified: Secondary | ICD-10-CM | POA: Diagnosis not present

## 2023-11-12 DIAGNOSIS — I129 Hypertensive chronic kidney disease with stage 1 through stage 4 chronic kidney disease, or unspecified chronic kidney disease: Secondary | ICD-10-CM | POA: Diagnosis not present

## 2023-11-12 DIAGNOSIS — K219 Gastro-esophageal reflux disease without esophagitis: Secondary | ICD-10-CM | POA: Diagnosis not present

## 2023-11-12 DIAGNOSIS — E782 Mixed hyperlipidemia: Secondary | ICD-10-CM | POA: Diagnosis not present

## 2023-11-12 DIAGNOSIS — N1831 Chronic kidney disease, stage 3a: Secondary | ICD-10-CM | POA: Diagnosis not present

## 2023-11-13 NOTE — Congregational Nurse Program (Signed)
CN 

## 2023-11-13 NOTE — Congregational Nurse Program (Signed)
CN office visit with interpreter [name] assisting.  Patient needed help to complete a health survey from [facility].  States she is doing well and saw her PCP Dr. Wynelle Link yesterday. Brantley Fling RN, Congregational Nurse [phone]

## 2023-11-27 NOTE — Congregational Nurse Program (Signed)
CN office visit with interpreter [name] assisting.  Patient brought letter from PCP regarding lab results from recent visit so that we could interpret them for her.  There was a note that she needs to see a hematologist but patient unaware.  Phone call to Dr. Lysle Rubens office.  They will have someone call her back hopefully with an interpreter and if none available they will call CN.  Brantley Fling RN, Congregational nurse [phone]

## 2023-12-24 ENCOUNTER — Telehealth: Payer: Self-pay

## 2023-12-24 NOTE — Telephone Encounter (Signed)
 Phone call to patient with assistance from interpreter [name].  Informed patient that [facility] called regarding a referral regarding abnormal blood testing/anemia.  They would like to schedule an appointment and patient agreed to do so.  They will call CN back tomorrow to make appointment. Elveria Rummer RN, Congregational Nurse [phone]

## 2023-12-25 NOTE — Congregational Nurse Program (Signed)
 CN office visit with interpreter [name] assisting. Patient has decided to schedule appointment with hematologist per referral from her PCP Dr. Paulene Boron.  Phone call to [facility] and set up appointment with Dr. Alita Irwin on 01/28/2024 @ 1:30 pm.  Raeford Bullion RN, Congregational Nurse [phone]

## 2024-01-27 DIAGNOSIS — D75839 Thrombocytosis, unspecified: Secondary | ICD-10-CM | POA: Insufficient documentation

## 2024-01-27 NOTE — Progress Notes (Unsigned)
 [facility] CONSULT NOTE  Patient Care Team: Deatra James, MD as PCP - General (Family Medicine) Kermit Balo, PTA (Inactive) as Physical Therapist (Physical Therapy)   ASSESSMENT & PLAN 73 y.o.female with history of HTN, HLD, IDA, GERD referred to Hematology for thrombocytosis.  The etiology is not clear at this time and requires further testing and investigation.  No problem-specific Assessment & Plan notes found for this encounter.     No orders of the defined types were placed in this encounter.   All questions were answered. The patient knows to call the clinic with any problems, questions or concerns. I spent {CHL ONC TIME VISIT - ZOXWR:[id number]} counseling the patient face to face, counseling and review of plan of care.   Melven Sartorius, MD 01/27/2024 10:29 PM   CHIEF COMPLAINTS/PURPOSE OF CONSULTATION:  Thrombocytosis   HISTORY OF PRESENTING ILLNESS:  Brianna Camacho 73 y.o. female is here because of elevated platelet count. Records showed thrombosis dated to 2012 from [facility] health records.  Evaluation of Thrombocytosis:  {Yes/No:30480221}:  Recent trauma or surgery {Yes/No:30480221}:  Prior surgical removal of the spleen or asplenia {Yes/No:30480221}:  Local or systemic complaints suggesting infection or inflammation {Yes/No:30480221}:  Autoimmune or Rheumatologic condition {Yes/No:30480221}:  Present and past history of blood loss or bleeding {Yes/No:30480221}:  History of thrombosis {Yes/No:30480221}:  History of iron deficiency {Yes/No:30480221}:  Prior diagnosis of a chronic hematologic disorder {Yes/No:30480221}:  Weight loss, fatigue, and other systemic complaints suggesting the presence of a malignancy {Yes/No:30480221}:  Medication use: steroids, epinephrine, ATRA, LMWH, chemotherapy {Yes/No:30480221}:  Extensive exercises  MEDICAL HISTORY:  Past Medical History:  Diagnosis Date   Arthritis    Closed fracture of left distal radius     Gastritis    Hyperlipemia    Hypertension    Iron deficiency anemia    UTI (urinary tract infection)     SURGICAL HISTORY: Past Surgical History:  Procedure Laterality Date   CESAREAN SECTION     ESOPHAGOGASTRODUODENOSCOPY  03/06/2012   Procedure: ESOPHAGOGASTRODUODENOSCOPY (EGD);  Surgeon: Theda Belfast, MD;  Location: [facility] ENDOSCOPY;  Service: Endoscopy;  Laterality: N/A;   HAND SURGERY     HOT HEMOSTASIS  03/06/2012   Procedure: HOT HEMOSTASIS (ARGON PLASMA COAGULATION/BICAP);  Surgeon: Theda Belfast, MD;  Location: [facility] ENDOSCOPY;  Service: Endoscopy;;   ORIF WRIST FRACTURE Left 11/23/2017   Procedure: OPEN REDUCTION INTERNAL FIXATION (ORIF) WRIST FRACTURE;  Surgeon: Dominica Severin, MD;  Location: [facility] OR;  Service: Orthopedics;  Laterality: Left;    SOCIAL HISTORY: Social History   Socioeconomic History   Marital status: Divorced    Spouse name: Not on file   Number of children: Not on file   Years of education: Not on file   Highest education level: Not on file  Occupational History   Not on file  Tobacco Use   Smoking status: Never   Smokeless tobacco: Never  Vaping Use   Vaping status: Never Used  Substance and Sexual Activity   Alcohol use: No   Drug use: No   Sexual activity: Not on file  Other Topics Concern   Not on file  Social History Narrative   Not on file   Social Drivers of Health   Financial Resource Strain: Not on file  Food Insecurity: No Food Insecurity (11/13/2023)   Hunger Vital Sign    Worried About Running Out of Food in the Last Year: Never true    Ran Out of Food in the Last Year: Never  true  Transportation Needs: No Transportation Needs (11/13/2023)   PRAPARE - Administrator, Civil Service (Medical): No    Lack of Transportation (Non-Medical): No  Physical Activity: Not on file  Stress: Not on file  Social Connections: Not on file  Intimate Partner Violence: Not At Risk (11/13/2023)   Humiliation, Afraid, Rape, and Kick  questionnaire    Fear of Current or Ex-Partner: No    Emotionally Abused: No    Physically Abused: No    Sexually Abused: No    FAMILY HISTORY: No family history on file.  ALLERGIES:  has no known allergies.  MEDICATIONS:  Current Outpatient Medications  Medication Sig Dispense Refill   acetaminophen (TYLENOL) 500 MG tablet Take 500 mg by mouth daily as needed for mild pain.     esomeprazole (NEXIUM) 40 MG capsule Take 40 mg by mouth daily before breakfast.     guaiFENesin (ROBITUSSIN) 100 MG/5ML liquid Take 5-10 mLs (100-200 mg total) by mouth every 4 (four) hours as needed for cough. (Patient not taking: Reported on 11/19/2017) 60 mL 0   HYDROcodone-acetaminophen (NORCO/VICODIN) 5-325 MG tablet Take 1 tablet by mouth every 4 (four) hours as needed. 16 tablet 0   Liniments ([place] PAIN RELIEF PATCH EX) Apply 1 patch topically daily as needed (pain).     lisinopril (PRINIVIL,ZESTRIL) 40 MG tablet Take 40 mg by mouth daily.     phenazopyridine (PYRIDIUM) 95 MG tablet Take 1 tablet (95 mg total) by mouth 3 (three) times daily as needed for pain. (Patient not taking: Reported on 11/19/2017) 10 tablet 0   rosuvastatin (CRESTOR) 20 MG tablet Take 20 mg by mouth daily.     No current facility-administered medications for this visit.    REVIEW OF SYSTEMS:   All relevant systems were reviewed with the patient and are negative.   PHYSICAL EXAMINATION: ECOG PERFORMANCE STATUS: {CHL ONC ECOG PS:[id number]}  There were no vitals filed for this visit. There were no vitals filed for this visit.  GENERAL: alert, no distress and comfortable SKIN: skin color normal and no bruising or petechiae on exposed skin EYES: normal, sclera clear OROPHARYNX: no exudate  NECK: No palpable mass LYMPH:  no palpable cervical, axillary or inguinal lymphadenopathy  LUNGS: clear to auscultation and percussion with normal breathing effort HEART: regular rate & rhythm and no murmurs  ABDOMEN: abdomen soft,  non-tender and nondistended. Musculoskeletal: no edema NEURO: no focal motor/sensory deficits  LABORATORY DATA:  I have reviewed the data as listed No results found for this or any previous visit (from the past 2160 hours).  RADIOGRAPHIC STUDIES: I have personally reviewed the radiological images as listed and agreed with the findings in the report. No results found.

## 2024-01-27 NOTE — Assessment & Plan Note (Signed)
 We will check CBC with differential, blood smear, iron studies with ferritin, iron TIBC, iron saturation  We will check CRP.  Elevated CRP is a marker for reactive thrombocytosis.  Recommend BM biopsy to assess for primary marrow process, Morey Hummingbird Thi will return for further evaluation and will likely need additional laboratory testing.

## 2024-01-28 ENCOUNTER — Inpatient Hospital Stay: Payer: 59

## 2024-01-28 VITALS — BP 122/68 | HR 100 | Temp 98.1°F | Resp 17 | Wt 134.5 lb

## 2024-01-28 DIAGNOSIS — M1990 Unspecified osteoarthritis, unspecified site: Secondary | ICD-10-CM | POA: Insufficient documentation

## 2024-01-28 DIAGNOSIS — D75839 Thrombocytosis, unspecified: Secondary | ICD-10-CM | POA: Insufficient documentation

## 2024-01-28 DIAGNOSIS — M545 Low back pain, unspecified: Secondary | ICD-10-CM

## 2024-01-28 LAB — URINALYSIS, COMPLETE (UACMP) WITH MICROSCOPIC
Bacteria, UA: NONE SEEN
Bilirubin Urine: NEGATIVE
Glucose, UA: NEGATIVE mg/dL
Hgb urine dipstick: NEGATIVE
Ketones, ur: NEGATIVE mg/dL
Leukocytes,Ua: NEGATIVE
Nitrite: NEGATIVE
Protein, ur: NEGATIVE mg/dL
Specific Gravity, Urine: 1.003 — ABNORMAL LOW (ref 1.005–1.030)
pH: 7 (ref 5.0–8.0)

## 2024-01-28 LAB — CBC WITH DIFFERENTIAL (CANCER CENTER ONLY)
Abs Immature Granulocytes: 0.03 10*3/uL (ref 0.00–0.07)
Basophils Absolute: 0.1 10*3/uL (ref 0.0–0.1)
Basophils Relative: 1 %
Eosinophils Absolute: 0.2 10*3/uL (ref 0.0–0.5)
Eosinophils Relative: 3 %
HCT: 39 % (ref 36.0–46.0)
Hemoglobin: 11.8 g/dL — ABNORMAL LOW (ref 12.0–15.0)
Immature Granulocytes: 0 %
Lymphocytes Relative: 26 %
Lymphs Abs: 2.4 10*3/uL (ref 0.7–4.0)
MCH: 21.5 pg — ABNORMAL LOW (ref 26.0–34.0)
MCHC: 30.3 g/dL (ref 30.0–36.0)
MCV: 71.2 fL — ABNORMAL LOW (ref 80.0–100.0)
Monocytes Absolute: 0.7 10*3/uL (ref 0.1–1.0)
Monocytes Relative: 8 %
Neutro Abs: 5.7 10*3/uL (ref 1.7–7.7)
Neutrophils Relative %: 62 %
Platelet Count: 816 10*3/uL — ABNORMAL HIGH (ref 150–400)
RBC: 5.48 MIL/uL — ABNORMAL HIGH (ref 3.87–5.11)
RDW: 16 % — ABNORMAL HIGH (ref 11.5–15.5)
WBC Count: 9.2 10*3/uL (ref 4.0–10.5)
nRBC: 0 % (ref 0.0–0.2)

## 2024-01-28 LAB — CMP (CANCER CENTER ONLY)
ALT: 37 U/L (ref 0–44)
AST: 25 U/L (ref 15–41)
Albumin: 4.4 g/dL (ref 3.5–5.0)
Alkaline Phosphatase: 51 U/L (ref 38–126)
Anion gap: 7 (ref 5–15)
BUN: 14 mg/dL (ref 8–23)
CO2: 27 mmol/L (ref 22–32)
Calcium: 9.3 mg/dL (ref 8.9–10.3)
Chloride: 105 mmol/L (ref 98–111)
Creatinine: 1.02 mg/dL — ABNORMAL HIGH (ref 0.44–1.00)
GFR, Estimated: 58 mL/min — ABNORMAL LOW (ref >60)
Glucose, Bld: 95 mg/dL (ref 70–99)
Potassium: 4 mmol/L (ref 3.5–5.1)
Sodium: 139 mmol/L (ref 135–145)
Total Bilirubin: 0.6 mg/dL (ref 0.0–1.2)
Total Protein: 7.4 g/dL (ref 6.5–8.1)

## 2024-01-28 LAB — IRON AND IRON BINDING CAPACITY (CC-WL,HP ONLY)
Iron: 86 ug/dL (ref 28–170)
Saturation Ratios: 24 % (ref 10.4–31.8)
TIBC: 364 ug/dL (ref 250–450)
UIBC: 278 ug/dL (ref 148–442)

## 2024-01-28 LAB — C-REACTIVE PROTEIN: CRP: <0.5 mg/dL (ref <1.0)

## 2024-01-28 LAB — LACTATE DEHYDROGENASE: LDH: 182 U/L (ref 98–192)

## 2024-01-28 NOTE — Addendum Note (Signed)
 Addended by: Geanie Berlin on: 01/28/2024 09:01 PM   Modules accepted: Level of Service

## 2024-01-29 LAB — FERRITIN: Ferritin: 203 ng/mL (ref 11–307)

## 2024-02-04 ENCOUNTER — Other Ambulatory Visit: Payer: Self-pay

## 2024-02-04 DIAGNOSIS — D75839 Thrombocytosis, unspecified: Secondary | ICD-10-CM

## 2024-02-04 LAB — NGS JAK2 E12-15/CALR/MPL

## 2024-02-06 ENCOUNTER — Telehealth: Payer: Self-pay

## 2024-02-06 NOTE — Telephone Encounter (Signed)
 TC to pt's son (daughter-in-law's name/number not in chart) this morning at approx 0900, no answer. Left VM asking that he call Dr. Nelta Numbers office. No return call as of 15:55. Attempted call again--no answer.

## 2024-02-06 NOTE — Telephone Encounter (Signed)
-----   Message from Melven Sartorius sent at 02/04/2024  8:08 PM EST ----- Angelica Chessman Please let daughter in law know no specific findings to explain high platelet. Therefore will need a bone marrow biopsy. Order placed. Patient was ok during the visit to proceed if needed and let daughter in law know without an interpreter. Follow up about 3-4 weeks after bone marrow biopsy. Thank you

## 2024-02-07 ENCOUNTER — Telehealth: Payer: Self-pay | Admitting: *Deleted

## 2024-02-07 NOTE — Telephone Encounter (Signed)
 Notified Brianna Camacho that no specific findings to explain elevated platelets. Will need BMBX. Brianna Camacho states BMBX has been scheduled for 3/25.   Message to scheduler for follow up appt with Dr Cherly Hensen 3-4 weeks after biopsy

## 2024-02-12 ENCOUNTER — Telehealth: Payer: Self-pay

## 2024-02-12 NOTE — Telephone Encounter (Signed)
 Patient's caregiver is aware of scheduled appointment times/dates

## 2024-02-24 NOTE — Progress Notes (Unsigned)
 Patient Care Team: Deatra James, MD as PCP - General (Family Medicine) Kermit Balo, PTA (Inactive) as Physical Therapist (Physical Therapy)  Clinic Day:  02/25/2024  Referring physician: Deatra James, MD  ASSESSMENT & PLAN:   Assessment & Plan: 73 y.o.female with history of HTN, HLD, IDA, GERD referred to Hematology for thrombocytosis.   Thrombocytosis of unclear etiology requiring more investigations. Persistent thrombocytosis. Normal ferritin. Nor hematuria. Negative JAK2 Ex 12-15, MPL and CALR. BM biopsy scheduled for 3/25.  Discussed lab results. Borderline renal insufficiency with thrombocytosis. Will obtain renal ultrasound to rule out kidney mass as sometimes RCC can present with thrombocytosis.    Thrombocytosis Assessment & Plan: Bone marrow biopsy scheduled Obtain renal ultrasound   Renal insufficiency Assessment & Plan: US kidneys  Orders: -     US RENAL; Future  Follow up in April as scheduled.   The patient understands the plans discussed today and is in agreement with them.  She knows to contact our office if she develops concerns prior to her next appointment.  Melven Sartorius, MD  [facility] - A DEPT OF [facility] [address] [place] [address] Dept: [phone] Dept Fax: [phone]   Orders Placed This Encounter  Procedures   US RENAL    Standing Status:   Future    Expected Date:   03/03/2024    Expiration Date:   02/24/2025    Reason for Exam (SYMPTOM  OR DIAGNOSIS REQUIRED):   rule out kidney mass with renal insufficiency and thrombocytosis    Preferred imaging location?:   [facility]      CHIEF COMPLAINT:  CC: elevated platelet  INTERVAL HISTORY:  Leisel Pinette is here today for follow up. She is feeling well. No new back pain, stomach pain. Pain on the shoulders for 4-5 days. No trouble urinating, hematuria, bloody stool, diarrhea, bowel changes, coughing, short  of breath, fever, chills, signs of infection, skin rash or lesion wound. No headache.    ALLERGIES:  has no known allergies.  MEDICATIONS:  Current Outpatient Medications  Medication Sig Dispense Refill   alendronate (FOSAMAX) 70 MG tablet Take 70 mg by mouth once a week. Take with a full glass of water on an empty stomach.     amLODipine (NORVASC) 10 MG tablet Take 10 mg by mouth daily.     esomeprazole (NEXIUM) 40 MG capsule Take 40 mg by mouth daily before breakfast.     famotidine (PEPCID) 20 MG tablet Take 20 mg by mouth at bedtime.     lisinopril (PRINIVIL,ZESTRIL) 40 MG tablet Take 40 mg by mouth daily.     rosuvastatin (CRESTOR) 20 MG tablet Take 20 mg by mouth daily.     No current facility-administered medications for this visit.    HISTORY OF PRESENT ILLNESS:   Past Medical History:  Diagnosis Date   Arthritis    Closed fracture of left distal radius    Gastritis    Hyperlipemia    Hypertension    Iron deficiency anemia    UTI (urinary tract infection)     REVIEW OF SYSTEMS:   All relevant systems were reviewed with the patient and are negative.   VITALS:  Blood pressure (!) 143/89.  Wt Readings from Last 3 Encounters:  01/28/24 134 lb 8 oz (61 kg)  11/03/20 120 lb (54.4 kg)  11/23/17 132 lb (59.9 kg)    There is no height or  weight on file to calculate BMI.  Performance status (ECOG): 0 - Asymptomatic  PHYSICAL EXAM:   GENERAL: alert, no distress and comfortable SKIN: skin color normal, no jaundice LUNGS: clear to auscultation with normal breathing effort.  No wheeze or Rales HEART: regular rate & rhythm and no lower extremity edema ABDOMEN: abdomen soft, non-tender and nondistended Musculoskeletal: No swelling  LABORATORY DATA:  I have reviewed the data as listed    Component Value Date/Time   NA 139 01/28/2024 1453   K 4.0 01/28/2024 1453   CL 105 01/28/2024 1453   CO2 27 01/28/2024 1453   GLUCOSE 95 01/28/2024 1453   BUN 14 01/28/2024  1453   CREATININE 1.02 (H) 01/28/2024 1453   CALCIUM 9.3 01/28/2024 1453   PROT 7.4 01/28/2024 1453   ALBUMIN 4.4 01/28/2024 1453   AST 25 01/28/2024 1453   ALT 37 01/28/2024 1453   ALKPHOS 51 01/28/2024 1453   BILITOT 0.6 01/28/2024 1453   GFRNONAA 58 (L) 01/28/2024 1453   GFRAA >60 11/23/2017 0619    No results found for: "SPEP", "UPEP"  Lab Results  Component Value Date   WBC 9.2 01/28/2024   NEUTROABS 5.7 01/28/2024   HGB 11.8 (L) 01/28/2024   HCT 39.0 01/28/2024   MCV 71.2 (L) 01/28/2024   PLT 816 (H) 01/28/2024      Chemistry      Component Value Date/Time   NA 139 01/28/2024 1453   K 4.0 01/28/2024 1453   CL 105 01/28/2024 1453   CO2 27 01/28/2024 1453   BUN 14 01/28/2024 1453   CREATININE 1.02 (H) 01/28/2024 1453      Component Value Date/Time   CALCIUM 9.3 01/28/2024 1453   ALKPHOS 51 01/28/2024 1453   AST 25 01/28/2024 1453   ALT 37 01/28/2024 1453   BILITOT 0.6 01/28/2024 1453       RADIOGRAPHIC STUDIES: I have personally reviewed the radiological images as listed and agreed with the findings in the report. No results found.

## 2024-02-25 ENCOUNTER — Inpatient Hospital Stay: Payer: 59

## 2024-02-25 VITALS — BP 156/100 | HR 93 | Temp 97.7°F | Resp 16 | Wt 136.0 lb

## 2024-02-25 DIAGNOSIS — D75839 Thrombocytosis, unspecified: Secondary | ICD-10-CM | POA: Diagnosis not present

## 2024-02-25 DIAGNOSIS — Z79899 Other long term (current) drug therapy: Secondary | ICD-10-CM | POA: Diagnosis not present

## 2024-02-25 DIAGNOSIS — N289 Disorder of kidney and ureter, unspecified: Secondary | ICD-10-CM | POA: Diagnosis not present

## 2024-02-25 NOTE — Assessment & Plan Note (Signed)
US kidneys

## 2024-02-25 NOTE — Assessment & Plan Note (Signed)
 Bone marrow biopsy scheduled Obtain renal ultrasound

## 2024-02-26 NOTE — Congregational Nurse Program (Signed)
 CN office visit with interpreter [name] assisting.  Patient asked for assistance making appointment for renal ultrasound ordered yesterday by Dr. Cherly Hensen.  Scheduled for 1:30 pm 03/02/2024 at [facility] radiology.  We gave her written instructions to arrive at 1:00 pm with a fuller bladder and to drink 32 ounces of water starting at 1:00 pm. Brantley Fling RN, Congregational Nurse [phone]

## 2024-03-02 ENCOUNTER — Other Ambulatory Visit: Payer: Self-pay | Admitting: Radiology

## 2024-03-02 ENCOUNTER — Ambulatory Visit (HOSPITAL_COMMUNITY)
Admission: RE | Admit: 2024-03-02 | Discharge: 2024-03-02 | Disposition: A | Discharge status: Home / Self Care | Source: Ambulatory Visit

## 2024-03-02 DIAGNOSIS — D75839 Thrombocytosis, unspecified: Secondary | ICD-10-CM

## 2024-03-02 DIAGNOSIS — R93422 Abnormal radiologic findings on diagnostic imaging of left kidney: Secondary | ICD-10-CM | POA: Diagnosis not present

## 2024-03-02 DIAGNOSIS — N289 Disorder of kidney and ureter, unspecified: Secondary | ICD-10-CM | POA: Diagnosis present

## 2024-03-02 DIAGNOSIS — N2889 Other specified disorders of kidney and ureter: Secondary | ICD-10-CM | POA: Diagnosis not present

## 2024-03-02 NOTE — H&P (Signed)
 Chief Complaint: Persistent thrombocytosis of unclear etiology; referred for CT guided bone marrow biopsy for further evaluation  Referring Provider(s): Chang,R  Supervising Physician: Roanna Banning  Patient Status: [facility] - Out-pt  History of Present Illness: Brianna Camacho is a 73 y.o. female with PMH sig for arthritis, HLD,HTN, anemia, GERD who presents now with persistent thrombocytosis of uncertain etiology along with borderline renal insufficiency. She is scheduled today for CT guided bone marrow biopsy for further evaluation.   *** Patient is Full Code  Past Medical History:  Diagnosis Date   Arthritis    Closed fracture of left distal radius    Gastritis    Hyperlipemia    Hypertension    Iron deficiency anemia    UTI (urinary tract infection)     Past Surgical History:  Procedure Laterality Date   CESAREAN SECTION     ESOPHAGOGASTRODUODENOSCOPY  03/06/2012   Procedure: ESOPHAGOGASTRODUODENOSCOPY (EGD);  Surgeon: Theda Belfast, MD;  Location[facility] ENDOSCOPY;  Service: Endoscopy;  Laterality: N/A;   HAND SURGERY     HOT HEMOSTASIS  03/06/2012   Procedure: HOT HEMOSTASIS (ARGON PLASMA COAGULATION/BICAP);  Surgeon: Theda Belfast, MD;  Location: [facility] ENDOSCOPY;  Service: Endoscopy;;   ORIF WRIST FRACTURE Left 11/23/2017   Procedure: OPEN REDUCTION INTERNAL FIXATION (ORIF) WRIST FRACTURE;  Surgeon: Dominica Severin, MD;  Location: [facility] OR;  Service: Orthopedics;  Laterality: Left;    Allergies: Patient has no known allergies.  Medications: Prior to Admission medications   Medication Sig Start Date End Date Taking? Authorizing Provider  alendronate (FOSAMAX) 70 MG tablet Take 70 mg by mouth once a week. Take with a full glass of water on an empty stomach.    [provider]  amLODipine (NORVASC) 10 MG tablet Take 10 mg by mouth daily.    [provider]  esomeprazole (NEXIUM) 40 MG capsule Take 40 mg by mouth daily before breakfast.    [provider]  famotidine (PEPCID) 20 MG tablet Take 20 mg by mouth at bedtime.    [provider]  lisinopril (PRINIVIL,ZESTRIL) 40 MG tablet Take 40 mg by mouth daily. 08/21/17   [provider]  rosuvastatin (CRESTOR) 20 MG tablet Take 20 mg by mouth daily.    [provider]     No family history on file.  Social History   Socioeconomic History   Marital status: Divorced    Spouse name: Not on file   Number of children: Not on file   Years of education: Not on file   Highest education level: Not on file  Occupational History   Not on file  Tobacco Use   Smoking status: Never   Smokeless tobacco: Never  Vaping Use   Vaping status: Never Used  Substance and Sexual Activity   Alcohol use: No   Drug use: No   Sexual activity: Not on file  Other Topics Concern   Not on file  Social History Narrative   Not on file   Social Drivers of Health   Financial Resource Strain: Not on file  Food Insecurity: No Food Insecurity (11/13/2023)   Hunger Vital Sign    Worried About Running Out of Food in the Last Year: Never true    Ran Out of Food in the Last Year: Never true  Transportation Needs: No Transportation Needs (11/13/2023)   PRAPARE - Administrator, Civil Service (Medical): No    Lack of Transportation (Non-Medical): No  Physical Activity: Not  on file  Stress: Not on file  Social Connections: Not on file       Review of Systems  Vital Signs:   Advance Care Plan:   Physical Exam  Imaging: No results found.  Labs:  CBC: Recent Labs    01/28/24 1453  WBC 9.2  HGB 11.8*  HCT 39.0  PLT 816*    COAGS: No results for input(s): "INR", "APTT" in the last 8760 hours.  BMP: Recent Labs    01/28/24 1453  NA 139  K 4.0  CL 105  CO2 27  GLUCOSE 95  BUN 14  CALCIUM 9.3  CREATININE 1.02*  GFRNONAA 58*    LIVER FUNCTION TESTS: Recent Labs    01/28/24 1453  BILITOT 0.6  AST 25  ALT 37  ALKPHOS 51  PROT 7.4   ALBUMIN 4.4    TUMOR MARKERS: No results for input(s): "AFPTM", "CEA", "CA199", "CHROMGRNA" in the last 8760 hours.  Assessment and Plan: 73 y.o. female with PMH sig for arthritis, HLD,HTN, anemia, GERD who presents now with persistent thrombocytosis of uncertain etiology along with borderline renal insufficiency. She is scheduled today for CT guided bone marrow biopsy for further evaluation. Risks and benefits of procedure was discussed with the patient including, but not limited to bleeding, infection, damage to adjacent structures or low yield requiring additional tests.  All of the questions were answered and there is agreement to proceed.  Consent signed and in chart.    Thank you for allowing our service to participate in Brianna Camacho 's care.  Electronically Signed: D. Jeananne Rama, PA-C   03/02/2024, 1:09 PM      I spent a total of  15 Minutes   in face to face in clinical consultation, greater than 50% of which was counseling/coordinating care for CT guided bone marrow biopsy

## 2024-03-03 ENCOUNTER — Other Ambulatory Visit: Payer: Self-pay

## 2024-03-03 ENCOUNTER — Ambulatory Visit (HOSPITAL_COMMUNITY)
Admission: RE | Admit: 2024-03-03 | Discharge: 2024-03-03 | Disposition: A | Discharge status: Home / Self Care | Payer: 59 | Source: Ambulatory Visit

## 2024-03-03 ENCOUNTER — Encounter (HOSPITAL_COMMUNITY): Payer: Self-pay

## 2024-03-03 DIAGNOSIS — Z1379 Encounter for other screening for genetic and chromosomal anomalies: Secondary | ICD-10-CM | POA: Insufficient documentation

## 2024-03-03 DIAGNOSIS — N289 Disorder of kidney and ureter, unspecified: Secondary | ICD-10-CM | POA: Diagnosis not present

## 2024-03-03 DIAGNOSIS — D509 Iron deficiency anemia, unspecified: Secondary | ICD-10-CM | POA: Diagnosis not present

## 2024-03-03 DIAGNOSIS — D75838 Other thrombocytosis: Secondary | ICD-10-CM | POA: Diagnosis not present

## 2024-03-03 DIAGNOSIS — D75839 Thrombocytosis, unspecified: Secondary | ICD-10-CM | POA: Diagnosis not present

## 2024-03-03 DIAGNOSIS — E785 Hyperlipidemia, unspecified: Secondary | ICD-10-CM | POA: Diagnosis not present

## 2024-03-03 DIAGNOSIS — M1990 Unspecified osteoarthritis, unspecified site: Secondary | ICD-10-CM | POA: Insufficient documentation

## 2024-03-03 DIAGNOSIS — I10 Essential (primary) hypertension: Secondary | ICD-10-CM | POA: Diagnosis not present

## 2024-03-03 DIAGNOSIS — K219 Gastro-esophageal reflux disease without esophagitis: Secondary | ICD-10-CM | POA: Diagnosis not present

## 2024-03-03 LAB — CBC WITH DIFFERENTIAL/PLATELET
Abs Immature Granulocytes: 0.01 10*3/uL (ref 0.00–0.07)
Basophils Absolute: 0 10*3/uL (ref 0.0–0.1)
Basophils Relative: 1 %
Eosinophils Absolute: 0.3 10*3/uL (ref 0.0–0.5)
Eosinophils Relative: 5 %
HCT: 38.8 % (ref 36.0–46.0)
Hemoglobin: 11.6 g/dL — ABNORMAL LOW (ref 12.0–15.0)
Immature Granulocytes: 0 %
Lymphocytes Relative: 29 %
Lymphs Abs: 1.7 10*3/uL (ref 0.7–4.0)
MCH: 21.6 pg — ABNORMAL LOW (ref 26.0–34.0)
MCHC: 29.9 g/dL — ABNORMAL LOW (ref 30.0–36.0)
MCV: 72.3 fL — ABNORMAL LOW (ref 80.0–100.0)
Monocytes Absolute: 0.6 10*3/uL (ref 0.1–1.0)
Monocytes Relative: 10 %
Neutro Abs: 3.2 10*3/uL (ref 1.7–7.7)
Neutrophils Relative %: 55 %
Platelets: 671 10*3/uL — ABNORMAL HIGH (ref 150–400)
RBC: 5.37 MIL/uL — ABNORMAL HIGH (ref 3.87–5.11)
RDW: 16.2 % — ABNORMAL HIGH (ref 11.5–15.5)
WBC: 5.8 10*3/uL (ref 4.0–10.5)
nRBC: 0 % (ref 0.0–0.2)

## 2024-03-03 MED ORDER — FENTANYL CITRATE (PF) 100 MCG/2ML IJ SOLN
INTRAMUSCULAR | Status: AC | PRN
  Administered 2024-03-03: 50 ug via INTRAVENOUS

## 2024-03-03 MED ORDER — FENTANYL CITRATE (PF) 100 MCG/2ML IJ SOLN
INTRAMUSCULAR | Status: AC
  Filled 2024-03-03: qty 2

## 2024-03-03 MED ORDER — NALOXONE HCL 0.4 MG/ML IJ SOLN
INTRAMUSCULAR | Status: AC
  Filled 2024-03-03: qty 1

## 2024-03-03 MED ORDER — FENTANYL CITRATE (PF) 100 MCG/2ML IJ SOLN
INTRAMUSCULAR | Status: AC | PRN
Start: 2024-03-03 — End: 2024-03-03
  Administered 2024-03-03: 25 ug via INTRAVENOUS

## 2024-03-03 MED ORDER — SODIUM CHLORIDE 0.9 % IV SOLN: INTRAVENOUS | Status: DC

## 2024-03-03 NOTE — Discharge Instructions (Signed)
 For questions /concerns may call Interventional Radiology at [phone] or  Interventional Radiology clinic [phone]   You may remove your dressing and shower tomorrow afternoon                                              Moderate Conscious Sedation, Adult, Care After After the procedure, it is common to have: Sleepiness for a few hours. Impaired judgment for a few hours. Trouble with balance. Nausea or vomiting if you eat too soon. Follow these instructions at home: For the time period you were told by your health care provider:  Rest. Do not participate in activities where you could fall or become injured. Do not drive or use machinery. Do not drink alcohol. Do not take sleeping pills or medicines that cause drowsiness. Do not make important decisions or sign legal documents. Do not take care of children on your own. Eating and drinking Follow instructions from your health care provider about what you may eat and drink. Drink enough fluid to keep your urine pale yellow. If you vomit: Drink clear fluids slowly and in small amounts as you are able. Clear fluids include water, ice chips, low-calorie sports drinks, and fruit juice that has water added to it (diluted fruit juice). Eat light and bland foods in small amounts as you are able. These foods include bananas, applesauce, rice, lean meats, toast, and crackers. General instructions Take over-the-counter and prescription medicines only as told by your health care provider. Have a responsible adult stay with you for the time you are told. Do not use any products that contain nicotine or tobacco. These products include cigarettes, chewing tobacco, and vaping devices, such as e-cigarettes. If you need help quitting, ask your health care provider. Return to your normal activities as told by your health care provider. Ask your health care provider what activities are safe for you. Your health care provider may give you more  instructions. Make sure you know what you can and cannot do. Contact a health care provider if: You are still sleepy or having trouble with balance after 24 hours. You feel light-headed. You vomit every time you eat or drink. You get a rash. You have a fever. You have redness or swelling around the IV site. Get help right away if: You have trouble breathing. You start to feel confused at home. These symptoms may be an emergency. Get help right away. Call 911. Do not wait to see if the symptoms will go away. Do not drive yourself to the hospital. This information is not intended to replace advice given to you by your health care provider. Make sure you discuss any questions you have with your health care provider. Document Revised: 06/11/2022 Document Reviewed: 06/11/2022 Elsevier Patient Education  2024 Elsevier Inc.                                                         Bone Marrow Aspiration and Bone Marrow Biopsy, Adult, Care After The following information offers guidance on how to care for yourself after your procedure. Your health care provider may also give you more specific instructions. If you have problems or questions, contact your health care provider.  What can I expect after the procedure? After the procedure, it is common to have: Mild pain and tenderness. Swelling. Bruising. Follow these instructions at home: Incision care  Follow instructions from your health care provider about how to take care of the incision site. Make sure you: Wash your hands with soap and water for at least 20 seconds before and after you change your bandage (dressing). If soap and water are not available, use hand sanitizer. Change your dressing as told by your health care provider. Leave stitches (sutures), skin glue, or adhesive strips in place. These skin closures may need to stay in place for 2 weeks or longer. If adhesive strip edges start to loosen and curl up, you may trim the loose  edges. Do not remove adhesive strips completely unless your health care provider tells you to do that. Check your incision site every day for signs of infection. Check for: More redness, swelling, or pain. Fluid or blood. Warmth. Pus or a bad smell. Activity Return to your normal activities as told by your health care provider. Ask your health care provider what activities are safe for you. Do not lift anything that is heavier than 10 lb (4.5 kg), or the limit that you are told, until your health care provider says that it is safe. If you were given a sedative during the procedure, it can affect you for several hours. Do not drive or operate machinery until your health care provider says that it is safe. General instructions  Take over-the-counter and prescription medicines only as told by your health care provider. Do not take baths, swim, or use a hot tub until your health care provider approves. Ask your health care provider if you may take showers. You may only be allowed to take sponge baths. If directed, put ice on the affected area. To do this: Put ice in a plastic bag. Place a towel between your skin and the bag. Leave the ice on for 20 minutes, 2-3 times a day. If your skin turns bright red, remove the ice right away to prevent skin damage. The risk of skin damage is higher if you cannot feel pain, heat, or cold. Contact a health care provider if: You have signs of infection. Your pain is not controlled with medicine. You have cancer, and a temperature of 100.46F (38C) or higher. Get help right away if: You have a temperature of 101F (38.3C) or higher, or as told by your health care provider. You have bleeding from the incision site that cannot be controlled. This information is not intended to replace advice given to you by your health care provider. Make sure you discuss any questions you have with your health care provider. Document Revised: 04/02/2022 Document Reviewed:  04/02/2022 Elsevier Patient Education  2024 [employer].

## 2024-03-03 NOTE — Procedures (Signed)
 Vascular and Interventional Radiology Procedure Note  Patient: Mckaila Duffus DOB: 1951/05/22 Medical Record Number: [id number] Note Date/Time: 03/03/24 9:41 AM   Performing Physician: Roanna Banning, MD Assistant(s): None  Diagnosis: Thrombocytosis   Procedure: BONE MARROW ASPIRATION and BIOPSY  Anesthesia: Conscious Sedation Complications: None Estimated Blood Loss: Minimal Specimens: Sent for Pathology  Findings:  Successful CT-guided bone marrow aspiration and biopsy A total of 1 cores were obtained. Hemostasis of the tract was achieved using Manual Pressure.  Plan: Bed rest for 1 hours.  See detailed procedure note with images in PACS. The patient tolerated the procedure well without incident or complication and was returned to Recovery in stable condition.    Roanna Banning, MD [facility] Radiology   Pager. [phone] Clinic. [phone]

## 2024-03-03 NOTE — Progress Notes (Signed)
 During patient preop work for CT Bone marrow, patient states she drank milk at 0700 for her gastric reflux.  0981  Called and informed PA- Jeananne Rama of same

## 2024-03-04 ENCOUNTER — Telehealth: Payer: Self-pay

## 2024-03-04 NOTE — Telephone Encounter (Signed)
 Phone call from patient requesting help in making eye doctor appointment for annual exam.  Called [facility] at [phone] [address] [employer] [address] [phone].  Scheduled appointment for 03/19/2024 @ 2:20 pm.  Interpreter [name] will accompany patient.  Brantley Fling RN, Congregational Nurse [phone]

## 2024-03-05 LAB — SURGICAL PATHOLOGY

## 2024-03-09 ENCOUNTER — Encounter (HOSPITAL_COMMUNITY): Payer: Self-pay

## 2024-03-10 ENCOUNTER — Encounter (HOSPITAL_COMMUNITY): Payer: Self-pay

## 2024-04-02 NOTE — Progress Notes (Unsigned)
 Patient Care Team: Sun, Vyvyan, MD as PCP - General (Family Medicine) Cher Cordial, PTA (Inactive) as Physical Therapist (Physical Therapy)  Clinic Day:  04/03/2024  Referring physician: Sun, Vyvyan, MD  ASSESSMENT & PLAN:   Assessment & Plan: 73 y.o.female with history of HTN, HLD, IDA, GERD referred to Hematology for thrombocytosis.   Work up for thrombocytosis including normal ferritin, negative JAK2 Ex 12-15, MPL and CALR. Persistent thrombocytosis. Renal ultrasound was negative for kidney mass. BM biopsy was obtained. No signs of infections. No pulmonary symptoms.  BM biopsy showed normocellular bone marrow with megakaryocytic hyperplasia with clustering and hyper lobulation. 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  Discussed results. Given the findings essential thrombocytosis is in the DDX. She has thalassemia trait. [facility] records showed elevated platelet since 2012. Discuss continue monitoring is reasonable. There is no reversible intervention.    Thrombocytosis Assessment & Plan: Labs ordered for 4 months Follow up in about 4 months Heart healthy diet  Orders: -     CBC with Differential (Cancer Center Only); Future -     Lactate dehydrogenase; Future -     CMP (Cancer Center only); Future -     Ferritin; Future   Follow up in April as scheduled.   The patient understands the plans discussed today and is in agreement with them.  She knows to contact our office if she develops concerns prior to her next appointment.  Lowanda Ruddy, MD  [facility] - A DEPT [name] Dept: [phone] Dept Fax: [phone]   Orders Placed This Encounter  Procedures   CBC with Differential (Cancer Center Only)    Standing Status:   Future    Expiration Date:    04/03/2025   Lactate dehydrogenase    Standing Status:   Future    Expiration Date:   04/03/2025   CMP (Cancer Center only)    Standing Status:   Future    Expiration Date:   04/03/2025   Ferritin    Standing Status:   Future    Expiration Date:   04/03/2025      CHIEF COMPLAINT:  CC: elevated platelet  INTERVAL HISTORY:  Brianna Camacho is here today for follow up. She is feeling well. No new back pain, stomach pain. No trouble urinating, hematuria, bloody stool, coughing, short of breath, fever, signs of infection, skin infection or dental pain. No headache.    ALLERGIES:  has no known allergies.  MEDICATIONS:  Current Outpatient Medications  Medication Sig Dispense Refill   alendronate (FOSAMAX) 70 MG tablet Take 70 mg by mouth once a week. Take with a full glass of water on an empty stomach.     amLODipine (NORVASC) 10 MG tablet Take 10 mg by mouth daily.     esomeprazole (NEXIUM) 40 MG capsule Take 40 mg by mouth daily before breakfast.     famotidine (PEPCID) 20 MG tablet Take 20 mg by mouth at bedtime.     lisinopril (PRINIVIL,ZESTRIL) 40 MG tablet Take 40 mg by mouth daily.     rosuvastatin (CRESTOR) 20 MG tablet Take 20 mg by mouth daily.     No current facility-administered medications for this visit.    HISTORY OF PRESENT ILLNESS:   Past Medical History:  Diagnosis Date   Arthritis    Closed fracture of left distal  radius    Gastritis    Hyperlipemia    Hypertension    Iron deficiency anemia    UTI (urinary tract infection)    Past Surgical History:  Procedure Laterality Date   CESAREAN SECTION     ESOPHAGOGASTRODUODENOSCOPY  03/06/2012   Procedure: ESOPHAGOGASTRODUODENOSCOPY (EGD);  Surgeon: Almeda Aris, MD;  Location: [facility] ENDOSCOPY;  Service: Endoscopy;  Laterality: N/A;   HAND SURGERY     HOT HEMOSTASIS  03/06/2012   Procedure: HOT HEMOSTASIS (ARGON PLASMA COAGULATION/BICAP);  Surgeon: Almeda Aris, MD;  Location: [facility] ENDOSCOPY;  Service: Endoscopy;;   ORIF  WRIST FRACTURE Left 11/23/2017   Procedure: OPEN REDUCTION INTERNAL FIXATION (ORIF) WRIST FRACTURE;  Surgeon: Ronn Cohn, MD;  Location: [facility] OR;  Service: Orthopedics;  Laterality: Left;    REVIEW OF SYSTEMS:   All relevant systems were reviewed with the patient and are negative.   VITALS:  Blood pressure (!) 142/82, pulse 78, temperature 97.9 F (36.6 C), temperature source Temporal, resp. rate 16, weight 135 lb 6.4 oz (61.4 kg), SpO2 97%.  Wt Readings from Last 3 Encounters:  04/03/24 135 lb 6.4 oz (61.4 kg)  03/03/24 136 lb (61.7 kg)  02/25/24 136 lb (61.7 kg)    Body mass index is 25.58 kg/m.  Performance status (ECOG): 0 - Asymptomatic  PHYSICAL EXAM:   GENERAL: alert, no distress and comfortable SKIN: skin color normal, no jaundice  LABORATORY DATA:  I have reviewed the data as listed    Component Value Date/Time   NA 139 01/28/2024 1453   K 4.0 01/28/2024 1453   CL 105 01/28/2024 1453   CO2 27 01/28/2024 1453   GLUCOSE 95 01/28/2024 1453   BUN 14 01/28/2024 1453   CREATININE 1.02 (H) 01/28/2024 1453   CALCIUM  9.3 01/28/2024 1453   PROT 7.4 01/28/2024 1453   ALBUMIN 4.4 01/28/2024 1453   AST 25 01/28/2024 1453   ALT 37 01/28/2024 1453   ALKPHOS 51 01/28/2024 1453   BILITOT 0.6 01/28/2024 1453   GFRNONAA 58 (L) 01/28/2024 1453   GFRAA >60 11/23/2017 0619    No results found for: "SPEP", "UPEP"  Lab Results  Component Value Date   WBC 5.8 03/03/2024   NEUTROABS 3.2 03/03/2024   HGB 11.6 (L) 03/03/2024   HCT 38.8 03/03/2024   MCV 72.3 (L) 03/03/2024   PLT 671 (H) 03/03/2024      Chemistry      Component Value Date/Time   NA 139 01/28/2024 1453   K 4.0 01/28/2024 1453   CL 105 01/28/2024 1453   CO2 27 01/28/2024 1453   BUN 14 01/28/2024 1453   CREATININE 1.02 (H) 01/28/2024 1453      Component Value Date/Time   CALCIUM  9.3 01/28/2024 1453   ALKPHOS 51 01/28/2024 1453   AST 25 01/28/2024 1453   ALT 37 01/28/2024 1453   BILITOT 0.6  01/28/2024 1453

## 2024-04-03 ENCOUNTER — Inpatient Hospital Stay

## 2024-04-03 VITALS — BP 142/82 | HR 78 | Temp 97.9°F | Resp 16 | Wt 135.4 lb

## 2024-04-03 DIAGNOSIS — D75839 Thrombocytosis, unspecified: Secondary | ICD-10-CM | POA: Insufficient documentation

## 2024-04-03 DIAGNOSIS — D563 Thalassemia minor: Secondary | ICD-10-CM | POA: Insufficient documentation

## 2024-04-03 NOTE — Assessment & Plan Note (Addendum)
 Labs ordered for 4 months Follow up in about 4 months Heart healthy diet

## 2024-04-22 NOTE — Congregational Nurse Program (Signed)
 CN office visit with interpreter [name] assisting.  Patient requested help completing [facility] health survey for her husband and understanding EOB's she received from [facility].  Raeford Bullion RN, Congregational Nurse [phone]

## 2024-05-27 NOTE — Congregational Nurse Program (Signed)
 CN office visit with interpreter [name] assisting.  We helped patient complete paperwork for upcoming wellness visit (06/15/24)  with Dr. Paulene Boron at [facility].  Raeford Bullion RN, Congregational Nurse [phone].

## 2024-06-15 DIAGNOSIS — I129 Hypertensive chronic kidney disease with stage 1 through stage 4 chronic kidney disease, or unspecified chronic kidney disease: Secondary | ICD-10-CM | POA: Diagnosis not present

## 2024-06-15 DIAGNOSIS — D473 Essential (hemorrhagic) thrombocythemia: Secondary | ICD-10-CM | POA: Diagnosis not present

## 2024-06-15 DIAGNOSIS — M810 Age-related osteoporosis without current pathological fracture: Secondary | ICD-10-CM | POA: Diagnosis not present

## 2024-06-15 DIAGNOSIS — E782 Mixed hyperlipidemia: Secondary | ICD-10-CM | POA: Diagnosis not present

## 2024-06-15 DIAGNOSIS — K219 Gastro-esophageal reflux disease without esophagitis: Secondary | ICD-10-CM | POA: Diagnosis not present

## 2024-06-15 DIAGNOSIS — N1832 Chronic kidney disease, stage 3b: Secondary | ICD-10-CM | POA: Diagnosis not present

## 2024-06-15 DIAGNOSIS — Z0000 Encounter for general adult medical examination without abnormal findings: Secondary | ICD-10-CM | POA: Diagnosis not present

## 2024-06-17 NOTE — Congregational Nurse Program (Signed)
 CN office visit wit interpreter [name] assisting.  Patient requested help scheduling appointment for bone density test at [facility] [address] [place], [place].  Made appointment for Tuesday June 30, 2024 at 8:30 am.  Elveria Rummer RN, Congregational Nurse [phone]

## 2024-06-24 NOTE — Congregational Nurse Program (Signed)
 CN office visit with interpreter [name] assisting.  Assisted patient in completing survey from [facility], reviewed lab results from [facility], and confirmed appointment with [facility] for bone density on 06/30/2024 @ 8:30 am.  Interpreter [name] will accompany patient to appointment.  Elveria Rummer RN, Congregational Nurse [phone]

## 2024-06-30 DIAGNOSIS — M8588 Other specified disorders of bone density and structure, other site: Secondary | ICD-10-CM | POA: Diagnosis not present

## 2024-08-03 NOTE — Progress Notes (Unsigned)
 [facility] OFFICE PROGRESS NOTE  Patient Care Team: Sun, Vyvyan, MD as PCP - General (Family Medicine) Honor Stapler, PTA (Inactive) as Physical Therapist (Physical Therapy)  73 y.o.female with history of HTN, HLD, IDA, GERD referred to Hematology for thrombocytosis.   Work up for thrombocytosis including normal ferritin, negative JAK2 Ex 12-15, MPL and CALR. Persistent thrombocytosis. Renal ultrasound was negative for kidney mass. BM biopsy was obtained. No signs of infections. No pulmonary symptoms.  BM biopsy showed normocellular bone marrow with megakaryocytic hyperplasia with clustering and hyper lobulation. 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 No aspirin needed unless develop thrombosis. Ref: Front Oncol. 2021 Mar 87;88:362883. doi: 10.3389/fonc.2021.637116 Labs ordered for 6-12 months Follow up in about 6-12 months Will bring CBC, CMP and LDH from PCP's lab work Heart healthy diet    Pauletta JAYSON Chihuahua, MD  INTERVAL HISTORY: Patient returns for follow-up. Overall feeling well.  She denies any night sweats, weight loss, decreased appetite, fever, mass, unusual bleeding or bruising.  Overall feeling well.  She would like to be follow-up about yearly since she is feeling well.  PHYSICAL EXAMINATION: ECOG PERFORMANCE STATUS: 0 - Asymptomatic  Vitals:   08/04/24 0910  BP: 138/74  Pulse: 78  Resp: 18  Temp: 98.1 F (36.7 C)  SpO2: 98%   Filed Weights   08/04/24 0910  Weight: 134 lb 14.4 oz (61.2 kg)    GENERAL: alert, no distress and comfortable SKIN: skin color normal and no bruising or petechiae on exposed skin EYES: normal, sclera clear LYMPH:  no palpable cervical, axillary lymphadenopathy  LUNGS: clear to auscultation and percussion with normal breathing  effort HEART: regular rate & rhythm  ABDOMEN: abdomen soft, non-tender and nondistended. Musculoskeletal: no edema   Relevant data reviewed during this visit included labs.

## 2024-08-03 NOTE — Assessment & Plan Note (Addendum)
 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 No aspirin needed unless develop thrombosis. Ref: Front Oncol. 2021 Mar 87;88:362883. doi: 10.3389/fonc.2021.637116 Labs ordered for 6-12 months Follow up in about 6-12 months Will bring CBC, CMP and LDH from PCP's lab work Heart healthy diet

## 2024-08-04 ENCOUNTER — Inpatient Hospital Stay

## 2024-08-04 VITALS — BP 138/74 | HR 78 | Temp 98.1°F | Resp 18 | Wt 134.9 lb

## 2024-08-04 DIAGNOSIS — D75839 Thrombocytosis, unspecified: Secondary | ICD-10-CM | POA: Diagnosis not present

## 2024-08-04 LAB — CBC WITH DIFFERENTIAL (CANCER CENTER ONLY)
Abs Immature Granulocytes: 0.05 K/uL (ref 0.00–0.07)
Basophils Absolute: 0.1 K/uL (ref 0.0–0.1)
Basophils Relative: 0 %
Eosinophils Absolute: 0.3 K/uL (ref 0.0–0.5)
Eosinophils Relative: 3 %
HCT: 35.5 % — ABNORMAL LOW (ref 36.0–46.0)
Hemoglobin: 10.9 g/dL — ABNORMAL LOW (ref 12.0–15.0)
Immature Granulocytes: 0 %
Lymphocytes Relative: 17 %
Lymphs Abs: 1.9 K/uL (ref 0.7–4.0)
MCH: 21.1 pg — ABNORMAL LOW (ref 26.0–34.0)
MCHC: 30.7 g/dL (ref 30.0–36.0)
MCV: 68.8 fL — ABNORMAL LOW (ref 80.0–100.0)
Monocytes Absolute: 0.8 K/uL (ref 0.1–1.0)
Monocytes Relative: 7 %
Neutro Abs: 8.1 K/uL — ABNORMAL HIGH (ref 1.7–7.7)
Neutrophils Relative %: 73 %
Platelet Count: 744 K/uL — ABNORMAL HIGH (ref 150–400)
RBC: 5.16 MIL/uL — ABNORMAL HIGH (ref 3.87–5.11)
RDW: 16.1 % — ABNORMAL HIGH (ref 11.5–15.5)
WBC Count: 11.2 K/uL — ABNORMAL HIGH (ref 4.0–10.5)
nRBC: 0 % (ref 0.0–0.2)

## 2024-08-04 LAB — CMP (CANCER CENTER ONLY)
ALT: 24 U/L (ref 0–44)
AST: 23 U/L (ref 15–41)
Albumin: 4.3 g/dL (ref 3.5–5.0)
Alkaline Phosphatase: 46 U/L (ref 38–126)
Anion gap: 6 (ref 5–15)
BUN: 13 mg/dL (ref 8–23)
CO2: 25 mmol/L (ref 22–32)
Calcium: 8.9 mg/dL (ref 8.9–10.3)
Chloride: 107 mmol/L (ref 98–111)
Creatinine: 1.11 mg/dL — ABNORMAL HIGH (ref 0.44–1.00)
GFR, Estimated: 52 mL/min — ABNORMAL LOW (ref >60)
Glucose, Bld: 96 mg/dL (ref 70–99)
Potassium: 3.7 mmol/L (ref 3.5–5.1)
Sodium: 138 mmol/L (ref 135–145)
Total Bilirubin: 0.6 mg/dL (ref 0.0–1.2)
Total Protein: 7.3 g/dL (ref 6.5–8.1)

## 2024-08-04 LAB — LACTATE DEHYDROGENASE: LDH: 159 U/L (ref 98–192)

## 2024-08-04 LAB — FERRITIN: Ferritin: 195 ng/mL (ref 11–307)

## 2024-08-04 NOTE — Patient Instructions (Addendum)
 Please ask PCP to add CBC, CMP and LDH and bring lab result of CBC, CMP and LDH from PCP.  Called if new developing mass, drenching night sweats, unexpected weight loss or concerning symptoms.

## 2024-09-02 ENCOUNTER — Other Ambulatory Visit: Payer: Self-pay | Admitting: Family Medicine

## 2024-09-02 DIAGNOSIS — Z1231 Encounter for screening mammogram for malignant neoplasm of breast: Secondary | ICD-10-CM

## 2024-09-02 NOTE — Congregational Nurse Program (Signed)
 CN office visit. She brought a letter from the [facility] stating she needs to make appointment for annual mammogram. We called and scheduled appointment for 10/06/2024 @ 10:00 am.  Elveria Rummer RN, Congregational Nurse [phone]

## 2024-09-21 ENCOUNTER — Emergency Department (HOSPITAL_COMMUNITY)

## 2024-09-21 ENCOUNTER — Other Ambulatory Visit: Payer: Self-pay

## 2024-09-21 ENCOUNTER — Encounter (HOSPITAL_COMMUNITY): Payer: Self-pay

## 2024-09-21 ENCOUNTER — Emergency Department (HOSPITAL_COMMUNITY)
Admission: EM | Admit: 2024-09-21 | Discharge: 2024-09-21 | Disposition: A | Discharge status: Home / Self Care | Attending: Emergency Medicine | Admitting: Emergency Medicine

## 2024-09-21 DIAGNOSIS — I10 Essential (primary) hypertension: Secondary | ICD-10-CM | POA: Insufficient documentation

## 2024-09-21 DIAGNOSIS — M51369 Other intervertebral disc degeneration, lumbar region without mention of lumbar back pain or lower extremity pain: Secondary | ICD-10-CM | POA: Diagnosis not present

## 2024-09-21 DIAGNOSIS — X58XXXA Exposure to other specified factors, initial encounter: Secondary | ICD-10-CM | POA: Diagnosis not present

## 2024-09-21 DIAGNOSIS — Z79899 Other long term (current) drug therapy: Secondary | ICD-10-CM | POA: Diagnosis not present

## 2024-09-21 DIAGNOSIS — M48061 Spinal stenosis, lumbar region without neurogenic claudication: Secondary | ICD-10-CM | POA: Diagnosis not present

## 2024-09-21 DIAGNOSIS — S39012A Strain of muscle, fascia and tendon of lower back, initial encounter: Secondary | ICD-10-CM | POA: Diagnosis not present

## 2024-09-21 DIAGNOSIS — M549 Dorsalgia, unspecified: Secondary | ICD-10-CM | POA: Diagnosis not present

## 2024-09-21 DIAGNOSIS — M5441 Lumbago with sciatica, right side: Secondary | ICD-10-CM | POA: Insufficient documentation

## 2024-09-21 DIAGNOSIS — M545 Low back pain, unspecified: Secondary | ICD-10-CM | POA: Diagnosis present

## 2024-09-21 DIAGNOSIS — M5442 Lumbago with sciatica, left side: Secondary | ICD-10-CM | POA: Insufficient documentation

## 2024-09-21 DIAGNOSIS — M5431 Sciatica, right side: Secondary | ICD-10-CM

## 2024-09-21 MED ORDER — LIDOCAINE 5 % EX PTCH
1.0000 | MEDICATED_PATCH | CUTANEOUS | Status: DC
  Administered 2024-09-21: 1 via TRANSDERMAL
  Filled 2024-09-21: qty 1

## 2024-09-21 MED ORDER — HYDROCODONE-ACETAMINOPHEN 5-325 MG PO TABS
1.0000 | ORAL_TABLET | Freq: Once | ORAL | Status: AC
  Administered 2024-09-21: 1 via ORAL
  Filled 2024-09-21: qty 1

## 2024-09-21 MED ORDER — PREDNISONE 10 MG (21) PO TBPK
ORAL_TABLET | Freq: Every day | ORAL | 0 refills | Status: AC
Start: 2024-09-21 — End: ?

## 2024-09-21 MED ORDER — LIDOCAINE 5 % EX PTCH: 1.0000 | MEDICATED_PATCH | CUTANEOUS | 0 refills | Status: AC

## 2024-09-21 MED ORDER — PREDNISONE 20 MG PO TABS
60.0000 mg | ORAL_TABLET | Freq: Once | ORAL | Status: AC
  Administered 2024-09-21: 60 mg via ORAL
  Filled 2024-09-21: qty 3

## 2024-09-21 NOTE — ED Provider Notes (Signed)
 [place] EMERGENCY DEPARTMENT AT [facility] Provider Note   CSN: [id number] Arrival date & time: 09/21/24  9163     Patient presents with: Back Pain   [name] is a 73 y.o. female who presents with a 2-week history of lower back pain with radiation down the bilateral legs, states that she has increased pain on the left compared to right, also complains of intermittent numbness on the soles of her feet.  Denies distal numbness or paresthesia, denies any bladder or bowel incontinence.  States that pain began when she had lifted a 25 pound bag of rice, feels that she may have strained her back at that time.  Has a previous medical diagnosis of anemia, peptic ulcer disease, hypertension, and review of her medications does show prescriptions for alendronate as well as for calcium  supplementation.    Back Pain      Prior to Admission medications   Medication Sig Start Date End Date Taking? Authorizing Provider  lidocaine (LIDODERM) 5 % Place 1 patch onto the skin daily. Remove & Discard patch within 12 hours or as directed by MD 09/21/24  Yes Myriam Dorn BROCKS, PA  predniSONE (STERAPRED UNI-PAK 21 TAB) 10 MG (21) TBPK tablet Take by mouth daily. Take 6 tabs by mouth daily  for 2 days, then 5 tabs for 2 days, then 4 tabs for 2 days, then 3 tabs for 2 days, 2 tabs for 2 days, then 1 tab by mouth daily for 2 days 09/21/24  Yes Myriam Dorn BROCKS, PA  alendronate (FOSAMAX) 70 MG tablet Take 70 mg by mouth once a week. Take with a full glass of water on an empty stomach.    [provider]  amLODipine (NORVASC) 10 MG tablet Take 10 mg by mouth daily.    [provider]  Cholecalciferol 50 MCG (2000 UT) CAPS 1 capsule.    [provider]  esomeprazole (NEXIUM) 40 MG capsule Take 40 mg by mouth daily before breakfast.    [provider]  famotidine (PEPCID) 20 MG tablet Take 20 mg by mouth at bedtime.    [provider]  Iron,  Ferrous Sulfate, 325 (65 Fe) MG TABS 1 tablet Orally once a day    [provider]  lisinopril (PRINIVIL,ZESTRIL) 40 MG tablet Take 40 mg by mouth daily. 08/21/17   [provider]  Omega-3 Fatty Acids (FISH OIL) 1000 MG CAPS 1 capsule with a meal Orally Once a day    [provider]  rosuvastatin (CRESTOR) 20 MG tablet Take 20 mg by mouth daily.    [provider]    Allergies: Patient has no known allergies.    Review of Systems  Musculoskeletal:  Positive for back pain and myalgias.  All other systems reviewed and are negative.   Updated Vital Signs BP 114/75   Pulse 99   Temp 98 F (36.7 C) (Oral)   Resp 18   Ht 5' 1 (1.549 m)   Wt 61.2 kg   SpO2 97%   BMI 25.51 kg/m   Physical Exam Vitals and nursing note reviewed.  Constitutional:      General: She is not in acute distress.    Appearance: Normal appearance.  HENT:     Head: Normocephalic and atraumatic.     Mouth/Throat:     Mouth: Mucous membranes are moist.     Pharynx: Oropharynx is clear.  Eyes:     Extraocular Movements: Extraocular movements intact.  Conjunctiva/sclera: Conjunctivae normal.     Pupils: Pupils are equal, round, and reactive to light.  Cardiovascular:     Rate and Rhythm: Normal rate and regular rhythm.     Pulses: Normal pulses.     Heart sounds: Normal heart sounds. No murmur heard.    No friction rub. No gallop.  Pulmonary:     Effort: Pulmonary effort is normal.     Breath sounds: Normal breath sounds.  Abdominal:     General: Abdomen is flat. Bowel sounds are normal.     Palpations: Abdomen is soft.  Musculoskeletal:     Cervical back: Normal, normal range of motion and neck supple.     Thoracic back: Normal.     Lumbar back: Tenderness present. Positive right straight leg raise test and positive left straight leg raise test.     Right lower leg: No edema.     Left lower leg: No edema.     Comments: Mild tenderness to paraspinal muscles  primarily left-sided.  Skin:    General: Skin is warm and dry.     Capillary Refill: Capillary refill takes less than 2 seconds.  Neurological:     General: No focal deficit present.     Mental Status: She is alert and oriented to person, place, and time. Mental status is at baseline.     GCS: GCS eye subscore is 4. GCS verbal subscore is 5. GCS motor subscore is 6.     Cranial Nerves: No cranial nerve deficit.     Sensory: No sensory deficit.     Motor: No weakness.     Coordination: Coordination is intact.     Gait: Gait is intact.  Psychiatric:        Mood and Affect: Mood normal.     (all labs ordered are listed, but only abnormal results are displayed) Labs Reviewed - No data to display  EKG: None  Radiology: CT Lumbar Spine Wo Contrast Result Date: 09/21/2024 CLINICAL DATA:  Back pain. EXAM: CT LUMBAR SPINE WITHOUT CONTRAST TECHNIQUE: Multidetector CT imaging of the lumbar spine was performed without intravenous contrast administration. Multiplanar CT image reconstructions were also generated. RADIATION DOSE REDUCTION: This exam was performed according to the departmental dose-optimization program which includes automated exposure control, adjustment of the mA and/or kV according to patient size and/or use of iterative reconstruction technique. COMPARISON:  None Available. FINDINGS: Segmentation: 5 lumbar type vertebrae. Alignment: Normal. Vertebrae: No acute fracture or focal pathologic process. Paraspinal and other soft tissues: Duodenal diverticulum evident. There is mild atherosclerotic calcification of the abdominal aorta without aneurysm. Disc levels: Marked loss of intervertebral disc height noted L2-3, L4-5 common L5-S1 with associated endplate degeneration. Moderate multifactorial central canal stenosis identified at L4-5. IMPRESSION: 1. No acute fracture or subluxation of the lumbar spine. 2. 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 3.  Aortic  Atherosclerosis (ICD10-I70.0). Electronically Signed   By: Camellia Candle M.D.   On: 09/21/2024 10:18     Procedures   Medications Ordered in the ED  lidocaine (LIDODERM) 5 % 1 patch (has no administration in time range)  predniSONE (DELTASONE) tablet 60 mg (has no administration in time range)  HYDROcodone -acetaminophen  (NORCO/VICODIN) 5-325 MG per tablet 1 tablet (1 tablet Oral Given 09/21/24 0930)                                    Medical Decision  Making Amount and/or Complexity of Data Reviewed Radiology: ordered.  Risk Prescription drug management.   Given this patient's noted medications of alendronate as well as vitamin D supplementation, and increased fracture risk, obtain CT scan of the lumbar spine to evaluate for fracture of the same.  This showed some degenerative changes however did not show any acute fractures nor did it show any acute etiology that would be causative of her current back pain.  Given this finding, along with physical exam findings of positive straight leg test bilaterally, and paraspinal muscle tenderness to the lower back, fine likely this is a muscular strain with associated sciatica.  Will manage her acute pain with Lidoderm patches, she has a noted decrease in renal function on previous labs therefore will opt for course of prednisone to manage for back pain, referred to primary care for continued management.  This was discussed with the patient, they understand and agree of no further concerns at this time.  Patient did request influenza vaccination at reassessment, this was discussed with patient this is not something is available here in the emergency department and referred her to a local pharmacy for the same.  As imaging findings are negative for acute changes, her vital signs remained stable within normal limits, that she has a reassuring physical exam findings she is stable for discharge and outpatient management as noted at this time.     Final  diagnoses:  Strain of lumbar region, initial encounter  Bilateral sciatica    ED Discharge Orders          Ordered    lidocaine (LIDODERM) 5 %  Every 24 hours        09/21/24 1054    predniSONE (STERAPRED UNI-PAK 21 TAB) 10 MG (21) TBPK tablet  Daily        09/21/24 1054               Myriam Dorn BROCKS, [place] 09/21/24 1102    Francesca Elsie CROME, MD 09/22/24 0715

## 2024-09-21 NOTE — ED Triage Notes (Signed)
 [place] interpreter used for triage.  Pt c/o back pain and difficulty walking that started a week ago. Pt states it started hurting after carrying large bag of rice. Pt denies numbness or tingling in extremities. Pt states she normally has urinary incontinence, denies changes in bowel/bladder incontinence. States she does have constipation, last BM today.

## 2024-09-23 NOTE — Congregational Nurse Program (Signed)
 CN office visit with interpreter [name] assisting. Reviewed medication dose and purpose.  States lidocaine patches help back pain very much. Taking prednisone as instructed. Scheduled ED follow-up appointment with Dr. Austin for 09/28/2024 @ 2:15 pm.   Elveria Rummer RN, Congregational Nurse [phone]

## 2024-09-28 DIAGNOSIS — N1831 Chronic kidney disease, stage 3a: Secondary | ICD-10-CM | POA: Diagnosis not present

## 2024-09-28 DIAGNOSIS — M5442 Lumbago with sciatica, left side: Secondary | ICD-10-CM | POA: Diagnosis not present

## 2024-09-28 DIAGNOSIS — Z23 Encounter for immunization: Secondary | ICD-10-CM | POA: Diagnosis not present

## 2024-09-28 DIAGNOSIS — I129 Hypertensive chronic kidney disease with stage 1 through stage 4 chronic kidney disease, or unspecified chronic kidney disease: Secondary | ICD-10-CM | POA: Diagnosis not present

## 2024-10-06 ENCOUNTER — Ambulatory Visit
Admission: RE | Admit: 2024-10-06 | Discharge: 2024-10-06 | Disposition: A | Source: Ambulatory Visit | Attending: Family Medicine | Admitting: Family Medicine

## 2024-10-06 DIAGNOSIS — Z1231 Encounter for screening mammogram for malignant neoplasm of breast: Secondary | ICD-10-CM | POA: Diagnosis not present

## 2025-08-05 ENCOUNTER — Ambulatory Visit
